# Patient Record
Sex: Male | Born: 1955 | Race: White | Hispanic: No | Marital: Married | Smoking: Never smoker
Health system: Southern US, Community
[De-identification: ages and names within clinical notes are randomized; demographics above are authoritative.]

## PROBLEM LIST (undated history)

## (undated) DIAGNOSIS — M109 Gout, unspecified: Secondary | ICD-10-CM

## (undated) DIAGNOSIS — N189 Chronic kidney disease, unspecified: Secondary | ICD-10-CM

## (undated) DIAGNOSIS — Z87442 Personal history of urinary calculi: Secondary | ICD-10-CM

## (undated) DIAGNOSIS — L570 Actinic keratosis: Secondary | ICD-10-CM

## (undated) DIAGNOSIS — I10 Essential (primary) hypertension: Secondary | ICD-10-CM

## (undated) DIAGNOSIS — K635 Polyp of colon: Secondary | ICD-10-CM

## (undated) DIAGNOSIS — E785 Hyperlipidemia, unspecified: Secondary | ICD-10-CM

## (undated) DIAGNOSIS — M1990 Unspecified osteoarthritis, unspecified site: Secondary | ICD-10-CM

## (undated) DIAGNOSIS — K429 Umbilical hernia without obstruction or gangrene: Secondary | ICD-10-CM

## (undated) DIAGNOSIS — S2239XA Fracture of one rib, unspecified side, initial encounter for closed fracture: Secondary | ICD-10-CM

## (undated) HISTORY — DX: Actinic keratosis: L57.0

## (undated) HISTORY — PX: ROTATOR CUFF REPAIR: SHX139

## (undated) HISTORY — DX: Polyp of colon: K63.5

## (undated) HISTORY — DX: Fracture of one rib, unspecified side, initial encounter for closed fracture: S22.39XA

## (undated) HISTORY — PX: JOINT REPLACEMENT: SHX530

## (undated) HISTORY — PX: HERNIA REPAIR: SHX51

## (undated) HISTORY — DX: Essential (primary) hypertension: I10

## (undated) HISTORY — DX: Hyperlipidemia, unspecified: E78.5

---
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
## 2007-07-08 ENCOUNTER — Emergency Department: Payer: Self-pay | Admitting: Emergency Medicine

## 2007-07-08 ENCOUNTER — Other Ambulatory Visit: Payer: Self-pay

## 2008-07-06 ENCOUNTER — Emergency Department: Payer: Self-pay | Admitting: Emergency Medicine

## 2010-07-27 ENCOUNTER — Emergency Department: Payer: Self-pay | Admitting: Emergency Medicine

## 2015-07-17 DIAGNOSIS — K635 Polyp of colon: Secondary | ICD-10-CM

## 2015-07-17 HISTORY — DX: Polyp of colon: K63.5

## 2015-07-17 HISTORY — PX: COLONOSCOPY: SHX174

## 2015-09-07 ENCOUNTER — Encounter: Payer: Self-pay | Admitting: *Deleted

## 2015-09-15 ENCOUNTER — Ambulatory Visit (INDEPENDENT_AMBULATORY_CARE_PROVIDER_SITE_OTHER): Payer: BLUE CROSS/BLUE SHIELD | Admitting: General Surgery

## 2015-09-15 ENCOUNTER — Encounter: Payer: Self-pay | Admitting: General Surgery

## 2015-09-15 VITALS — BP 166/88 | HR 80 | Resp 14 | Ht 72.0 in | Wt 211.0 lb

## 2015-09-15 DIAGNOSIS — K429 Umbilical hernia without obstruction or gangrene: Secondary | ICD-10-CM

## 2015-09-15 NOTE — Patient Instructions (Addendum)
The patient is aware to call back for any questions or concerns.  Hernia, Adult A hernia is the bulging of an organ or tissue through a weak spot in the muscles of the abdomen (abdominal wall). Hernias develop most often near the navel or groin. There are many kinds of hernias. Common kinds include:  Femoral hernia. This kind of hernia develops under the groin in the upper thigh area.  Inguinal hernia. This kind of hernia develops in the groin or scrotum.  Umbilical hernia. This kind of hernia develops near the navel.  Hiatal hernia. This kind of hernia causes part of the stomach to be pushed up into the chest.  Incisional hernia. This kind of hernia bulges through a scar from an abdominal surgery. CAUSES This condition may be caused by:  Heavy lifting.  Coughing over a long period of time.  Straining to have a bowel movement.  An incision made during an abdominal surgery.  A birth defect (congenital defect).  Excess weight or obesity.  Smoking.  Poor nutrition.  Cystic fibrosis.  Excess fluid in the abdomen.  Undescended testicles. SYMPTOMS Symptoms of a hernia include:  A lump on the abdomen. This is the first sign of a hernia. The lump may become more obvious with standing, straining, or coughing. It may get bigger over time if it is not treated or if the condition causing it is not treated.  Pain. A hernia is usually painless, but it may become painful over time if treatment is delayed. The pain is usually dull and may get worse with standing or lifting heavy objects. Sometimes a hernia gets tightly squeezed in the weak spot (strangulated) or stuck there (incarcerated) and causes additional symptoms. These symptoms may include:  Vomiting.  Nausea.  Constipation.  Irritability. DIAGNOSIS A hernia may be diagnosed with:  A physical exam. During the exam your health care provider may ask you to cough or to make a specific movement, because a hernia is usually  more visible when you move.  Imaging tests. These can include:  X-rays.  Ultrasound.  CT scan. TREATMENT A hernia that is small and painless may not need to be treated. A hernia that is large or painful may be treated with surgery. Inguinal hernias may be treated with surgery to prevent incarceration or strangulation. Strangulated hernias are always treated with surgery, because lack of blood to the trapped organ or tissue can cause it to die. Surgery to treat a hernia involves pushing the bulge back into place and repairing the weak part of the abdomen. HOME CARE INSTRUCTIONS  Avoid straining.  Do not lift anything heavier than 10 lb (4.5 kg).  Lift with your leg muscles, not your back muscles. This helps avoid strain.  When coughing, try to cough gently.  Prevent constipation. Constipation leads to straining with bowel movements, which can make a hernia worse or cause a hernia repair to break down. You can prevent constipation by:  Eating a high-fiber diet that includes plenty of fruits and vegetables.  Drinking enough fluids to keep your urine clear or pale yellow. Aim to drink 6-8 glasses of water per day.  Using a stool softener as directed by your health care provider.  Lose weight, if you are overweight.  Do not use any tobacco products, including cigarettes, chewing tobacco, or electronic cigarettes. If you need help quitting, ask your health care provider.  Keep all follow-up visits as directed by your health care provider. This is important. Your health care provider  may need to monitor your condition. SEEK MEDICAL CARE IF:  You have swelling, redness, and pain in the affected area.  Your bowel habits change. SEEK IMMEDIATE MEDICAL CARE IF:  You have a fever.  You have abdominal pain that is getting worse.  You feel nauseous or you vomit.  You cannot push the hernia back in place by gently pressing on it while you are lying down.  The hernia:  Changes in  shape or size.  Is stuck outside the abdomen.  Becomes discolored.  Feels hard or tender.   This information is not intended to replace advice given to you by your health care provider. Make sure you discuss any questions you have with your health care provider.   Document Released: 07/02/2005 Document Revised: 07/23/2014 Document Reviewed: 05/12/2014 Elsevier Interactive Patient Education 2016 [employer].  Patient's surgery has been scheduled for 10-13-15 at [facility].

## 2015-09-15 NOTE — Progress Notes (Signed)
Patient ID: Erik Moore, male   DOB: Jan 13, 1956, 60 y.o.   MRN: [id number]  Chief Complaint  Patient presents with  . Hernia    HPI Erik Moore is a 60 y.o. male.  Here today for evaluation of a possible umbilical hernia. 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 He is able to press it back in with no pain. Bowels move daily and are regular. He does not think it has changed dramatically in size.  He originally had surgery set up at a [facility] affiliated hospital, but it was canceled any never rescheduled.  I personally reviewed the patient's history.   HPI  Past Medical History  Diagnosis Date  . Hypertension   . Hyperlipidemia   . Colon polyp 2017  . Rib fracture     Past Surgical History  Procedure Laterality Date  . Tonsillectomy  1965  . Colonoscopy  2017    Dr Clayborne Dana    No family history on file.  Social History Social History  Substance Use Topics  . Smoking status: Never Smoker   . Smokeless tobacco: Never Used  . Alcohol Use: 0.0 oz/week    0 Standard drinks or equivalent per week     Comment: 3-4/day    No Known Allergies  Current Outpatient Prescriptions  Medication Sig Dispense Refill  . aspirin 81 MG tablet Take 81 mg by mouth every other day.    . hydrochlorothiazide (HYDRODIURIL) 12.5 MG tablet Take 12.5 mg by mouth daily.   0  . losartan (COZAAR) 50 MG tablet Take 50 mg by mouth daily.  0   No current facility-administered medications for this visit.    Review of Systems Review of Systems  Constitutional: Negative.   Respiratory: Negative.   Cardiovascular: Negative.   Gastrointestinal: Negative for nausea, abdominal pain, diarrhea and constipation.    Blood pressure 166/88, pulse 80, resp. rate 14, height 6' (1.829 m), weight 211 lb (95.709 kg).  Physical Exam Physical Exam  Constitutional: He is oriented to person, place,  and time. He appears well-developed and well-nourished.  HENT:  Mouth/Throat: Oropharynx is clear and moist.  Eyes: Conjunctivae are normal. No scleral icterus.  Neck: Neck supple.  Cardiovascular: Normal rate, regular rhythm and normal heart sounds.   Pulmonary/Chest: Effort normal and breath sounds normal.  Abdominal: Soft. Normal appearance. There is no tenderness.    Diastasis recti present. 1 cm umbilical facial defect.  Lymphadenopathy:    He has no cervical adenopathy.  Neurological: He is alert and oriented to person, place, and time.  Skin: Skin is warm and dry.  Psychiatric: His behavior is normal.    Data Reviewed Laboratory studies from his PCP office dated 06/22/2015, nonfasting, showed a scant elevation of the blood sugar 103, normal potassium at 4.6, creatinine of 0.9 with an estimated GFR of 92, normal liver function studies. Modestly elevated serum triglycerides at 156, cholesterol 185. Globin A1c 5.0     Assessment    Umbilical hernia, reducible.    Plan    Pros and cons of elective hernia repair reviewed.    Hernia precautions and incarceration were discussed with the patient. If they develop symptoms of an incarcerated hernia, they were encouraged to seek prompt medical attention.  I have recommended repair of the hernia on an outpatient basis in the near future. I think it is unlikely the patient will  require mesh repair if the defect is less than 3 cm in diameter. The risk of infection was reviewed. The role of prosthetic mesh to minimize the risk of recurrence was reviewed and larger hernias was reviewed.  Patient's surgery has been scheduled for 10-13-15 at [facility]. It is okay for patient to remain on 81 mg aspirin pre-op.  Ref: Honor Loh FNA/ Dr. Benita Gutter at [place]  This information has been scribed by Karie Fetch [place].    Robert Bellow 09/16/2015, 5:09 PM

## 2015-09-16 DIAGNOSIS — K429 Umbilical hernia without obstruction or gangrene: Secondary | ICD-10-CM | POA: Insufficient documentation

## 2015-09-16 NOTE — H&P (Signed)
HPI  Erik Moore is a 60 y.o. male. Here today for evaluation of a possible umbilical hernia. 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 He is able to press it back in with no pain. Bowels move daily and are regular. He does not think it has changed dramatically in size.  He originally had surgery set up at a [facility] affiliated hospital, but it was canceled any never rescheduled.  I personally reviewed the patient's history.  HPI  Past Medical History   Diagnosis  Date   .  Hypertension    .  Hyperlipidemia    .  Colon polyp  2017   .  Rib fracture     Past Surgical History   Procedure  Laterality  Date   .  Tonsillectomy   1965   .  Colonoscopy   2017     Dr Clayborne Dana    No family history on file.  Social History  Social History   Substance Use Topics   .  Smoking status:  Never Smoker   .  Smokeless tobacco:  Never Used   .  Alcohol Use:  0.0 oz/week     0 Standard drinks or equivalent per week      Comment: 3-4/day    No Known Allergies  Current Outpatient Prescriptions   Medication  Sig  Dispense  Refill   .  aspirin 81 MG tablet  Take 81 mg by mouth every other day.     .  hydrochlorothiazide (HYDRODIURIL) 12.5 MG tablet  Take 12.5 mg by mouth daily.   0   .  losartan (COZAAR) 50 MG tablet  Take 50 mg by mouth daily.   0    No current facility-administered medications for this visit.    Review of Systems  Review of Systems  Constitutional: Negative.  Respiratory: Negative.  Cardiovascular: Negative.  Gastrointestinal: Negative for nausea, abdominal pain, diarrhea and constipation.   Blood pressure 166/88, pulse 80, resp. rate 14, height 6' (1.829 m), weight 211 lb (95.709 kg).  Physical Exam  Physical Exam  Constitutional: He is oriented to person, place, and time. He appears well-developed and well-nourished.  HENT:  Mouth/Throat: Oropharynx is clear and  moist.  Eyes: Conjunctivae are normal. No scleral icterus.  Neck: Neck supple.  Cardiovascular: Normal rate, regular rhythm and normal heart sounds.  Pulmonary/Chest: Effort normal and breath sounds normal.  Abdominal: Soft. Normal appearance. There is no tenderness.    Diastasis recti present. 1 cm umbilical facial defect.  Lymphadenopathy:  He has no cervical adenopathy.  Neurological: He is alert and oriented to person, place, and time.  Skin: Skin is warm and dry.  Psychiatric: His behavior is normal.   Data Reviewed  Laboratory studies from his PCP office dated 06/22/2015, nonfasting, showed a scant elevation of the blood sugar 103, normal potassium at 4.6, creatinine of 0.9 with an estimated GFR of 92, normal liver function studies. Modestly elevated serum triglycerides at 156, cholesterol 185. Globin A1c 5.0  Assessment   Umbilical hernia, reducible.   Plan   Pros and cons of elective hernia repair reviewed.   Hernia precautions and incarceration were discussed with the patient. If they develop symptoms of an incarcerated hernia, they were encouraged to seek prompt medical attention.  I have recommended repair of the hernia on an outpatient basis in the near future. I  think it is unlikely the patient will require mesh repair if the defect is less than 3 cm in diameter. The risk of infection was reviewed. The role of prosthetic mesh to minimize the risk of recurrence was reviewed and larger hernias was reviewed.  Patient's surgery has been scheduled for 10-13-15 at [facility]. It is okay for patient to remain on 81 mg aspirin pre-op.  Ref: Honor Loh FNA/ Dr. Benita Gutter at [place]  This information has been scribed by Karie Fetch [place].  Robert Bellow  09/16/2015, 5:09 PM

## 2015-10-03 ENCOUNTER — Other Ambulatory Visit: Payer: Self-pay

## 2015-10-03 ENCOUNTER — Encounter: Payer: Self-pay | Admitting: *Deleted

## 2015-10-03 NOTE — Patient Instructions (Signed)
  Your procedure is scheduled on: 10-13-15 (THURSDAY) Report to [facility] To find out your arrival time please call [phone] between 1PM - 3PM on 10-12-15 [facility])  Remember: Instructions that are not followed completely may result in serious medical risk, up to and including death, or upon the discretion of your surgeon and anesthesiologist your surgery may need to be rescheduled.    _X___ 1. Do not eat food or drink liquids after midnight. No gum chewing or hard candies.     _X___ 2. No Alcohol for 24 hours before or after surgery.   ____ 3. Bring all medications with you on the day of surgery if instructed.    _X___ 4. Notify your doctor if there is any change in your medical condition     (cold, fever, infections).     Do not wear jewelry, make-up, hairpins, clips or nail polish.  Do not wear lotions, powders, or perfumes. You may wear deodorant.  Do not shave 48 hours prior to surgery. Men may shave face and neck.  Do not bring valuables to the hospital.    [facility] is not responsible for any belongings or valuables.               Contacts, dentures or bridgework may not be worn into surgery.  Leave your suitcase in the car. After surgery it may be brought to your room.  For patients admitted to the hospital, discharge time is determined by your treatment team.   Patients discharged the day of surgery will not be allowed to drive home.   Please read over the following fact sheets that you were given:     _X___ Take these medicines the morning of surgery with A SIP OF WATER:    1. LOSARTAN (COZAAR)  2.   3.   4.  5.  6.  ____ Fleet Enema (as directed)   _X___ Use CHG Soap as directed  ____ Use inhalers on the day of surgery  ____ Stop metformin 2 days prior to surgery    ____ Take 1/2 of usual insulin dose the night before surgery and none on the morning of surgery.   ____ Stop Coumadin/Plavix/aspirin-OK TO CONTINUE 81 MG  ASPIRIN PER DR BYRNETT  ____ Stop Anti-inflammatories   ____ Stop supplements until after surgery.    ____ Bring C-Pap to the hospital.

## 2015-10-10 ENCOUNTER — Encounter
Admission: RE | Admit: 2015-10-10 | Discharge: 2015-10-10 | Disposition: A | Discharge status: Home / Self Care | Payer: BLUE CROSS/BLUE SHIELD | Source: Ambulatory Visit | Attending: General Surgery | Admitting: General Surgery

## 2015-10-10 DIAGNOSIS — Z86010 Personal history of colonic polyps: Secondary | ICD-10-CM | POA: Diagnosis not present

## 2015-10-10 DIAGNOSIS — E785 Hyperlipidemia, unspecified: Secondary | ICD-10-CM | POA: Diagnosis not present

## 2015-10-10 DIAGNOSIS — K429 Umbilical hernia without obstruction or gangrene: Secondary | ICD-10-CM | POA: Diagnosis present

## 2015-10-10 DIAGNOSIS — Z79899 Other long term (current) drug therapy: Secondary | ICD-10-CM | POA: Diagnosis not present

## 2015-10-10 DIAGNOSIS — I10 Essential (primary) hypertension: Secondary | ICD-10-CM | POA: Diagnosis not present

## 2015-10-10 LAB — POTASSIUM: POTASSIUM: 3.8 mmol/L (ref 3.5–5.1)

## 2015-10-13 ENCOUNTER — Ambulatory Visit: Payer: BLUE CROSS/BLUE SHIELD | Admitting: Anesthesiology

## 2015-10-13 ENCOUNTER — Encounter: Payer: Self-pay | Admitting: Anesthesiology

## 2015-10-13 ENCOUNTER — Encounter
Admission: RE | Disposition: A | Discharge status: Home / Self Care | Payer: Self-pay | Source: Ambulatory Visit | Attending: General Surgery

## 2015-10-13 ENCOUNTER — Telehealth: Payer: Self-pay | Admitting: *Deleted

## 2015-10-13 ENCOUNTER — Ambulatory Visit
Admission: RE | Admit: 2015-10-13 | Discharge: 2015-10-13 | Disposition: A | Discharge status: Home / Self Care | Payer: BLUE CROSS/BLUE SHIELD | Source: Ambulatory Visit | Attending: General Surgery | Admitting: General Surgery

## 2015-10-13 DIAGNOSIS — I10 Essential (primary) hypertension: Secondary | ICD-10-CM | POA: Insufficient documentation

## 2015-10-13 DIAGNOSIS — K429 Umbilical hernia without obstruction or gangrene: Secondary | ICD-10-CM | POA: Insufficient documentation

## 2015-10-13 DIAGNOSIS — Z79899 Other long term (current) drug therapy: Secondary | ICD-10-CM | POA: Insufficient documentation

## 2015-10-13 DIAGNOSIS — Z86010 Personal history of colonic polyps: Secondary | ICD-10-CM | POA: Insufficient documentation

## 2015-10-13 DIAGNOSIS — E785 Hyperlipidemia, unspecified: Secondary | ICD-10-CM | POA: Insufficient documentation

## 2015-10-13 HISTORY — PX: UMBILICAL HERNIA REPAIR: SHX196

## 2015-10-13 HISTORY — DX: Chronic kidney disease, unspecified: N18.9

## 2015-10-13 SURGERY — REPAIR, HERNIA, UMBILICAL, ADULT
Anesthesia: General

## 2015-10-13 MED ORDER — LACTATED RINGERS IV SOLN
INTRAVENOUS | Status: DC
  Administered 2015-10-13 (×2): via INTRAVENOUS

## 2015-10-13 MED ORDER — PHENYLEPHRINE HCL 10 MG/ML IJ SOLN
INTRAMUSCULAR | Status: DC | PRN
  Administered 2015-10-13 (×2): 100 ug via INTRAVENOUS

## 2015-10-13 MED ORDER — PROPOFOL 10 MG/ML IV BOLUS
INTRAVENOUS | Status: DC | PRN
  Administered 2015-10-13: 160 mg via INTRAVENOUS

## 2015-10-13 MED ORDER — BUPIVACAINE HCL 0.5 % IJ SOLN
INTRAMUSCULAR | Status: DC | PRN
  Administered 2015-10-13: 30 mL

## 2015-10-13 MED ORDER — BUPIVACAINE HCL (PF) 0.5 % IJ SOLN
INTRAMUSCULAR | Status: AC
  Filled 2015-10-13: qty 30

## 2015-10-13 MED ORDER — HYDROCODONE-ACETAMINOPHEN 5-325 MG PO TABS: 1.0000 | ORAL_TABLET | ORAL | Status: DC | PRN

## 2015-10-13 MED ORDER — FENTANYL CITRATE (PF) 100 MCG/2ML IJ SOLN
INTRAMUSCULAR | Status: DC | PRN
  Administered 2015-10-13: 100 ug via INTRAVENOUS

## 2015-10-13 MED ORDER — DEXTROSE 5 % IV SOLN
2.0000 g | INTRAVENOUS | Status: DC
  Filled 2015-10-13: qty 20

## 2015-10-13 MED ORDER — DEXAMETHASONE SODIUM PHOSPHATE 10 MG/ML IJ SOLN
INTRAMUSCULAR | Status: DC | PRN
  Administered 2015-10-13: 10 mg via INTRAVENOUS

## 2015-10-13 MED ORDER — LIDOCAINE HCL (PF) 1 % IJ SOLN
INTRAMUSCULAR | Status: AC
  Filled 2015-10-13: qty 2

## 2015-10-13 MED ORDER — FAMOTIDINE 20 MG PO TABS
ORAL_TABLET | ORAL | Status: AC
  Administered 2015-10-13: 20 mg via ORAL
  Filled 2015-10-13: qty 1

## 2015-10-13 MED ORDER — ACETAMINOPHEN 10 MG/ML IV SOLN
INTRAVENOUS | Status: DC | PRN
  Administered 2015-10-13: 1000 mg via INTRAVENOUS

## 2015-10-13 MED ORDER — CEFAZOLIN SODIUM-DEXTROSE 2-4 GM/100ML-% IV SOLN
INTRAVENOUS | Status: AC
  Filled 2015-10-13: qty 100

## 2015-10-13 MED ORDER — ONDANSETRON HCL 4 MG/2ML IJ SOLN: 4.0000 mg | Freq: Once | INTRAMUSCULAR | Status: DC | PRN

## 2015-10-13 MED ORDER — MIDAZOLAM HCL 2 MG/2ML IJ SOLN
INTRAMUSCULAR | Status: DC | PRN
  Administered 2015-10-13: 2 mg via INTRAVENOUS

## 2015-10-13 MED ORDER — FENTANYL CITRATE (PF) 100 MCG/2ML IJ SOLN: 25.0000 ug | INTRAMUSCULAR | Status: DC | PRN

## 2015-10-13 MED ORDER — LIDOCAINE HCL (CARDIAC) 20 MG/ML IV SOLN
INTRAVENOUS | Status: DC | PRN
  Administered 2015-10-13: 100 mg via INTRAVENOUS

## 2015-10-13 MED ORDER — KETOROLAC TROMETHAMINE 30 MG/ML IJ SOLN
INTRAMUSCULAR | Status: DC | PRN
  Administered 2015-10-13: 30 mg via INTRAVENOUS

## 2015-10-13 MED ORDER — ACETAMINOPHEN 10 MG/ML IV SOLN
INTRAVENOUS | Status: AC
  Filled 2015-10-13: qty 100

## 2015-10-13 MED ORDER — ONDANSETRON HCL 4 MG/2ML IJ SOLN
INTRAMUSCULAR | Status: DC | PRN
  Administered 2015-10-13: 4 mg via INTRAVENOUS

## 2015-10-13 MED ORDER — CEFAZOLIN SODIUM-DEXTROSE 2-4 GM/100ML-% IV SOLN: 2.0000 g | INTRAVENOUS | Status: DC

## 2015-10-13 MED ORDER — FAMOTIDINE 20 MG PO TABS
20.0000 mg | ORAL_TABLET | Freq: Once | ORAL | Status: AC
  Administered 2015-10-13: 20 mg via ORAL

## 2015-10-13 SURGICAL SUPPLY — 32 items
BLADE SURG 15 STRL SS SAFETY (BLADE) ×3 IMPLANT
CANISTER SUCT 1200ML W/VALVE (MISCELLANEOUS) ×3 IMPLANT
CHLORAPREP W/TINT 26ML (MISCELLANEOUS) ×3 IMPLANT
CLOSURE WOUND 1/2 X4 (GAUZE/BANDAGES/DRESSINGS) ×1
DRAPE LAPAROTOMY 100X77 ABD (DRAPES) ×3 IMPLANT
DRESSING TELFA 4X3 1S ST N-ADH (GAUZE/BANDAGES/DRESSINGS) ×3 IMPLANT
DRSG TEGADERM 4X4.75 (GAUZE/BANDAGES/DRESSINGS) ×3 IMPLANT
ELECT REM PT RETURN 9FT ADLT (ELECTROSURGICAL) ×3
ELECTRODE REM PT RTRN 9FT ADLT (ELECTROSURGICAL) ×1 IMPLANT
GAUZE SPONGE 4X4 12PLY STRL (GAUZE/BANDAGES/DRESSINGS) IMPLANT
GLOVE BIO SURGEON STRL SZ7.5 (GLOVE) ×9 IMPLANT
GLOVE INDICATOR 8.0 STRL GRN (GLOVE) ×9 IMPLANT
GOWN STRL REUS W/ TWL LRG LVL3 (GOWN DISPOSABLE) ×3 IMPLANT
GOWN STRL REUS W/TWL LRG LVL3 (GOWN DISPOSABLE) ×6
KIT RM TURNOVER STRD PROC AR (KITS) ×3 IMPLANT
LABEL OR SOLS (LABEL) ×3 IMPLANT
NDL SAFETY 22GX1.5 (NEEDLE) ×3 IMPLANT
NEEDLE HYPO 25X1 1.5 SAFETY (NEEDLE) ×3 IMPLANT
NS IRRIG 500ML POUR BTL (IV SOLUTION) ×3 IMPLANT
PACK BASIN MINOR ARMC (MISCELLANEOUS) ×3 IMPLANT
PAD TELFA 2X3 NADH STRL (GAUZE/BANDAGES/DRESSINGS) ×3 IMPLANT
STRIP CLOSURE SKIN 1/2X4 (GAUZE/BANDAGES/DRESSINGS) ×2 IMPLANT
SUT PROLENE 0 CT 1 30 (SUTURE) IMPLANT
SUT SURGILON 0 BLK (SUTURE) ×3 IMPLANT
SUT VIC AB 3-0 54X BRD REEL (SUTURE) ×1 IMPLANT
SUT VIC AB 3-0 BRD 54 (SUTURE) ×2
SUT VIC AB 3-0 SH 27 (SUTURE) ×2
SUT VIC AB 3-0 SH 27X BRD (SUTURE) ×1 IMPLANT
SUT VIC AB 4-0 FS2 27 (SUTURE) ×3 IMPLANT
SWABSTK COMLB BENZOIN TINCTURE (MISCELLANEOUS) ×3 IMPLANT
SYR 3ML LL SCALE MARK (SYRINGE) ×3 IMPLANT
SYR CONTROL 10ML (SYRINGE) ×3 IMPLANT

## 2015-10-13 NOTE — Transfer of Care (Signed)
Immediate Anesthesia Transfer of Care Note  Patient: Erik Moore  Procedure(s) Performed: Procedure(s): HERNIA REPAIR UMBILICAL ADULT (N/A)  Patient Location: PACU  Anesthesia Type:General  Level of Consciousness: sedated  Airway & Oxygen Therapy: Patient Spontanous Breathing and Patient connected to face mask oxygen  Post-op Assessment: Report given to RN and Post -op Vital signs reviewed and stable  Post vital signs: Reviewed and stable  Last Vitals:  Filed Vitals:   10/13/15 0813  BP: 144/86  Pulse: 70  Temp: 36.7 C  Resp: 16    Complications: No apparent anesthesia complications

## 2015-10-13 NOTE — Op Note (Signed)
Preoperative diagnosis: Umbilical hernia.  Postoperative diagnosis: Same.  Operative procedure: Umbilical hernia repair.  Operating surgeon: Ollen Bowl, M.D.  Anesthesia: Gen. by LMA, Marcaine 0.5%, plain, 30 mL local infiltration.  Estimated blood loss: Less than 5 mL.  Clinical note: This 60 year old male has developed an umbilical hernia and a similar for elective repair. Hair was removed from the surgical site with clippers prior to presentation to the operating theater. He received Kefzol intravenously in the event prosthetic mesh wasn't required.  Operative note: With the patient under adequate general anesthesia the abdomen was prepped with ChloraPrep and draped. Marcaine was infiltrated for postoperative analgesia. An infraumbilical incision was made and carried down through skin and subcutaneous tissue with hemostasis achieved by electrocautery. The umbilical skin was elevated off the underlying sac with cautery. The sac was excised at the fascial layer and discarded. The undersurface of the fascia was cleared. Interrupted 0 Surgilon sutures were used to close the 1.2 cm fascial defect. The umbilical skin was tacked to the fascia with a 3-0 Vicryl figure-of-eight suture. The adipose layer was closed with a running 3-0 Vicryl suture. Skin was approximated with a running 4-0 Vicryl septic suture. Benzoin, Steri-Strips, Telfa and Tegaderm dressings were applied.  The patient tolerated the procedure well and was taken to the recovery room in stable condition.

## 2015-10-13 NOTE — Anesthesia Procedure Notes (Signed)
Procedure Name: LMA Insertion Date/Time: 10/13/2015 8:58 AM Performed by: Nelda Marseille Pre-anesthesia Checklist: Patient identified, Patient being monitored, Timeout performed, Emergency Drugs available and Suction available Patient Re-evaluated:Patient Re-evaluated prior to inductionOxygen Delivery Method: Circle system utilized Preoxygenation: Pre-oxygenation with 100% oxygen Intubation Type: IV induction Ventilation: Mask ventilation without difficulty LMA: LMA inserted LMA Size: 4.5 Tube type: Oral Number of attempts: 1 Placement Confirmation: positive ETCO2 and breath sounds checked- equal and bilateral Tube secured with: Tape Dental Injury: Teeth and Oropharynx as per pre-operative assessment

## 2015-10-13 NOTE — Telephone Encounter (Signed)
Patient called in at 3:15 pm states he saw small amount blood on the bandage and wanted to know if that was ok. Denies swelling.  Discussed with Rosann Auerbach, monitor the area for changes in symptoms, advised to use ice pack off and on today and call in the morning with a status update.

## 2015-10-13 NOTE — Discharge Instructions (Signed)

## 2015-10-13 NOTE — OR Nursing (Signed)
Dr. Bary Castilla in to visit patient prior to discharge.  Patient doing well with minimal discomforts and understands the limitations from surgery

## 2015-10-13 NOTE — H&P (Signed)
No change in clinical history or exam. For umbilical hernia repair.  

## 2015-10-13 NOTE — Anesthesia Preprocedure Evaluation (Signed)
Anesthesia Evaluation  Patient identified by MRN, date of birth, ID band Patient awake    Reviewed: Allergy & Precautions, NPO status , Patient's Chart, lab work & pertinent test results  Airway Mallampati: III  TM Distance: <3 FB Neck ROM: Full    Dental  (+) Chipped   Pulmonary    Pulmonary exam normal breath sounds clear to auscultation       Cardiovascular hypertension, Pt. on medications Normal cardiovascular exam     Neuro/Psych    GI/Hepatic negative GI ROS, Neg liver ROS,   Endo/Other  negative endocrine ROS  Renal/GU Renal InsufficiencyRenal disease     Musculoskeletal negative musculoskeletal ROS (+)   Abdominal Normal abdominal exam  (+)   Peds  Hematology negative hematology ROS (+)   Anesthesia Other Findings   Reproductive/Obstetrics                             Anesthesia Physical Anesthesia Plan  ASA: II  Anesthesia Plan: General   Post-op Pain Management:    Induction: Intravenous  Airway Management Planned: LMA  Additional Equipment:   Intra-op Plan:   Post-operative Plan: Extubation in OR  Informed Consent: I have reviewed the patients History and Physical, chart, labs and discussed the procedure including the risks, benefits and alternatives for the proposed anesthesia with the patient or authorized representative who has indicated his/her understanding and acceptance.   Dental advisory given  Plan Discussed with: CRNA and Surgeon  Anesthesia Plan Comments:         Anesthesia Quick Evaluation

## 2015-10-18 NOTE — Anesthesia Postprocedure Evaluation (Signed)
Anesthesia Post Note  Patient: Erik Moore  Procedure(s) Performed: Procedure(s) (LRB): HERNIA REPAIR UMBILICAL ADULT (N/A)  Patient location during evaluation: PACU Anesthesia Type: General Level of consciousness: awake and alert and oriented Pain management: pain level controlled Vital Signs Assessment: post-procedure vital signs reviewed and stable Respiratory status: spontaneous breathing Cardiovascular status: blood pressure returned to baseline Anesthetic complications: no    Last Vitals:  Filed Vitals:   10/13/15 1015 10/13/15 1032  BP:  135/80  Pulse:  60  Temp: 36.6 C 36.4 C  Resp:  16    Last Pain:  Filed Vitals:   10/14/15 0832  PainSc: 0-No pain                 [name]

## 2015-10-20 ENCOUNTER — Ambulatory Visit (INDEPENDENT_AMBULATORY_CARE_PROVIDER_SITE_OTHER): Payer: BLUE CROSS/BLUE SHIELD | Admitting: General Surgery

## 2015-10-20 ENCOUNTER — Encounter: Payer: Self-pay | Admitting: General Surgery

## 2015-10-20 VITALS — BP 152/88 | HR 76 | Resp 12 | Ht 72.0 in | Wt 209.0 lb

## 2015-10-20 DIAGNOSIS — K429 Umbilical hernia without obstruction or gangrene: Secondary | ICD-10-CM

## 2015-10-20 NOTE — Patient Instructions (Addendum)
The patient is aware to call back for any questions or concerns. Patient to return as needed. 

## 2015-10-20 NOTE — Progress Notes (Signed)
Patient ID: Erik Moore, male   DOB: 04-12-1956, 60 y.o.   MRN: [id number]  Chief Complaint  Patient presents with  . Routine Post Op    HPI Erik Moore is a 60 y.o. male.  Here today for postoperative visit, umbilical hernia on [id number]. He states he is doing well. Minimal to no pain.  HPI  Past Medical History  Diagnosis Date  . Hypertension   . Hyperlipidemia   . Colon polyp 2017  . Rib fracture   . Chronic kidney disease     H/O KIDNEY STONES    Past Surgical History  Procedure Laterality Date  . Tonsillectomy  1965  . Colonoscopy  2017    Dr Clayborne Dana  . Umbilical hernia repair N/A 10/13/2015    Procedure: HERNIA REPAIR UMBILICAL ADULT;  Surgeon: Robert Bellow, MD;  Location: [facility] ORS;  Service: General;  Laterality: N/A;    No family history on file.  Social History Social History  Substance Use Topics  . Smoking status: Never Smoker   . Smokeless tobacco: Never Used  . Alcohol Use: 0.0 oz/week    0 Standard drinks or equivalent per week     Comment: 3-4/day    No Known Allergies  Current Outpatient Prescriptions  Medication Sig Dispense Refill  . aspirin 81 MG tablet Take 81 mg by mouth every other day.    . hydrochlorothiazide (HYDRODIURIL) 12.5 MG tablet Take 12.5 mg by mouth daily.   0  . losartan (COZAAR) 50 MG tablet Take 50 mg by mouth every morning.   0   No current facility-administered medications for this visit.    Review of Systems Review of Systems  Constitutional: Negative.   Respiratory: Negative.   Cardiovascular: Negative.     Blood pressure 152/88, pulse 76, resp. rate 12, height 6' (1.829 m), weight 209 lb (94.802 kg).  Physical Exam Physical Exam  Constitutional: He is oriented to person, place, and time. He appears well-developed and well-nourished.  HENT:  Mouth/Throat: Oropharynx is clear and moist.  Eyes: Conjunctivae are normal. No scleral icterus.  Neck: Neck supple.  Abdominal: Soft. There is no tenderness.     Umbilical hernia is clean and intact.  Lymphadenopathy:    He has no cervical adenopathy.  Neurological: He is alert and oriented to person, place, and time.  Skin: Skin is warm and dry.  Psychiatric: His behavior is normal.       Assessment    Doing well status post primary repair umbilical hernia.    Plan    The patient will increase his activity as tolerated. Proper lifting technique reviewed.    Patient to return as needed.  Ref: Honor Loh FNA/ Dr. Benita Gutter at [place]    This information has been scribed by Karie Fetch RN, BSN,BC.   Robert Bellow 10/20/2015, 2:17 PM

## 2019-07-27 ENCOUNTER — Other Ambulatory Visit: Payer: Self-pay | Admitting: Physical Medicine and Rehabilitation

## 2019-07-27 DIAGNOSIS — M25511 Pain in right shoulder: Secondary | ICD-10-CM

## 2019-07-27 DIAGNOSIS — M5416 Radiculopathy, lumbar region: Secondary | ICD-10-CM

## 2019-08-05 ENCOUNTER — Other Ambulatory Visit: Payer: Self-pay

## 2019-08-05 ENCOUNTER — Ambulatory Visit
Admission: RE | Admit: 2019-08-05 | Discharge: 2019-08-05 | Disposition: A | Discharge status: Home / Self Care | Payer: Medicare Other | Source: Ambulatory Visit | Attending: Physical Medicine and Rehabilitation | Admitting: Physical Medicine and Rehabilitation

## 2019-08-05 DIAGNOSIS — M25511 Pain in right shoulder: Secondary | ICD-10-CM | POA: Diagnosis present

## 2019-08-05 DIAGNOSIS — M5416 Radiculopathy, lumbar region: Secondary | ICD-10-CM

## 2020-06-24 ENCOUNTER — Other Ambulatory Visit: Payer: Self-pay | Admitting: Orthopedic Surgery

## 2020-07-04 ENCOUNTER — Other Ambulatory Visit: Payer: Self-pay

## 2020-07-04 ENCOUNTER — Encounter: Payer: Self-pay | Admitting: Orthopedic Surgery

## 2020-07-11 ENCOUNTER — Other Ambulatory Visit
Admission: RE | Admit: 2020-07-11 | Discharge: 2020-07-11 | Disposition: A | Discharge status: Home / Self Care | Payer: Medicare Other | Source: Ambulatory Visit | Attending: Orthopedic Surgery | Admitting: Orthopedic Surgery

## 2020-07-11 ENCOUNTER — Other Ambulatory Visit: Payer: Self-pay

## 2020-07-11 DIAGNOSIS — Z01812 Encounter for preprocedural laboratory examination: Secondary | ICD-10-CM | POA: Insufficient documentation

## 2020-07-11 DIAGNOSIS — Z20822 Contact with and (suspected) exposure to covid-19: Secondary | ICD-10-CM | POA: Insufficient documentation

## 2020-07-11 NOTE — Anesthesia Preprocedure Evaluation (Addendum)
Anesthesia Evaluation  Patient identified by MRN, date of birth, ID band Patient awake    Reviewed: Allergy & Precautions, NPO status , Patient's Chart, lab work & pertinent test results  History of Anesthesia Complications Negative for: history of anesthetic complications  Airway Mallampati: III   Neck ROM: Full    Dental no notable dental hx.    Pulmonary neg pulmonary ROS,    Pulmonary exam normal breath sounds clear to auscultation       Cardiovascular Exercise Tolerance: Good hypertension, Normal cardiovascular exam Rhythm:Regular Rate:Normal     Neuro/Psych negative neurological ROS     GI/Hepatic negative GI ROS,   Endo/Other  negative endocrine ROS  Renal/GU Renal disease (nephrolithiasis)     Musculoskeletal   Abdominal   Peds  Hematology negative hematology ROS (+)   Anesthesia Other Findings   Reproductive/Obstetrics                            Anesthesia Physical Anesthesia Plan  ASA: II  Anesthesia Plan: General and Regional   Post-op Pain Management:  Regional for Post-op pain and GA combined w/ Regional for post-op pain   Induction: Intravenous  PONV Risk Score and Plan: 2 and Dexamethasone, Ondansetron and Treatment may vary due to age or medical condition  Airway Management Planned: Oral ETT  Additional Equipment:   Intra-op Plan:   Post-operative Plan: Extubation in OR  Informed Consent: I have reviewed the patients History and Physical, chart, labs and discussed the procedure including the risks, benefits and alternatives for the proposed anesthesia with the patient or authorized representative who has indicated his/her understanding and acceptance.       Plan Discussed with: CRNA  Anesthesia Plan Comments:        Anesthesia Quick Evaluation

## 2020-07-12 ENCOUNTER — Encounter: Payer: Self-pay | Admitting: Orthopedic Surgery

## 2020-07-12 ENCOUNTER — Ambulatory Visit: Payer: Medicare Other | Admitting: Anesthesiology

## 2020-07-12 ENCOUNTER — Encounter
Admission: RE | Disposition: A | Discharge status: Home / Self Care | Payer: Self-pay | Source: Home / Self Care | Attending: Orthopedic Surgery

## 2020-07-12 ENCOUNTER — Ambulatory Visit
Admission: RE | Admit: 2020-07-12 | Discharge: 2020-07-12 | Disposition: A | Discharge status: Home / Self Care | Payer: Medicare Other | Attending: Orthopedic Surgery | Admitting: Orthopedic Surgery

## 2020-07-12 ENCOUNTER — Other Ambulatory Visit: Payer: Self-pay

## 2020-07-12 DIAGNOSIS — X58XXXA Exposure to other specified factors, initial encounter: Secondary | ICD-10-CM | POA: Insufficient documentation

## 2020-07-12 DIAGNOSIS — Z791 Long term (current) use of non-steroidal anti-inflammatories (NSAID): Secondary | ICD-10-CM | POA: Insufficient documentation

## 2020-07-12 DIAGNOSIS — M25811 Other specified joint disorders, right shoulder: Secondary | ICD-10-CM | POA: Diagnosis not present

## 2020-07-12 DIAGNOSIS — S46111A Strain of muscle, fascia and tendon of long head of biceps, right arm, initial encounter: Secondary | ICD-10-CM | POA: Diagnosis not present

## 2020-07-12 DIAGNOSIS — M75101 Unspecified rotator cuff tear or rupture of right shoulder, not specified as traumatic: Secondary | ICD-10-CM | POA: Insufficient documentation

## 2020-07-12 DIAGNOSIS — Z79899 Other long term (current) drug therapy: Secondary | ICD-10-CM | POA: Insufficient documentation

## 2020-07-12 LAB — SARS CORONAVIRUS 2 (TAT 6-24 HRS): SARS Coronavirus 2: NEGATIVE

## 2020-07-12 SURGERY — SHOULDER ARTHROSCOPY WITH SUBACROMIAL DECOMPRESSION AND DISTAL CLAVICLE EXCISION
Anesthesia: Regional | Site: Shoulder | Laterality: Right

## 2020-07-12 MED ORDER — MIDAZOLAM HCL 5 MG/5ML IJ SOLN
INTRAMUSCULAR | Status: DC | PRN
  Administered 2020-07-12: 2 mg via INTRAVENOUS

## 2020-07-12 MED ORDER — ONDANSETRON 4 MG PO TBDP: 4.0000 mg | ORAL_TABLET | Freq: Three times a day (TID) | ORAL | 0 refills | Status: DC | PRN

## 2020-07-12 MED ORDER — EPHEDRINE SULFATE 50 MG/ML IJ SOLN
INTRAMUSCULAR | Status: DC | PRN
  Administered 2020-07-12 (×5): 5 mg via INTRAVENOUS
  Administered 2020-07-12 (×2): 10 mg via INTRAVENOUS

## 2020-07-12 MED ORDER — LACTATED RINGERS IV SOLN: INTRAVENOUS | Status: DC

## 2020-07-12 MED ORDER — CEFAZOLIN SODIUM-DEXTROSE 2-4 GM/100ML-% IV SOLN
2.0000 g | INTRAVENOUS | Status: AC
  Administered 2020-07-12: 11:00:00 2 g via INTRAVENOUS

## 2020-07-12 MED ORDER — BUPIVACAINE HCL (PF) 0.5 % IJ SOLN
INTRAMUSCULAR | Status: DC | PRN
  Administered 2020-07-12: 10 mL

## 2020-07-12 MED ORDER — DEXAMETHASONE SODIUM PHOSPHATE 4 MG/ML IJ SOLN
INTRAMUSCULAR | Status: DC | PRN
  Administered 2020-07-12: 4 mg via INTRAVENOUS

## 2020-07-12 MED ORDER — ACETAMINOPHEN 500 MG PO TABS: 1000.0000 mg | ORAL_TABLET | Freq: Three times a day (TID) | ORAL | 2 refills | Status: AC

## 2020-07-12 MED ORDER — OXYCODONE HCL 5 MG PO TABS: 5.0000 mg | ORAL_TABLET | ORAL | 0 refills | Status: DC | PRN

## 2020-07-12 MED ORDER — PROPOFOL 10 MG/ML IV BOLUS
INTRAVENOUS | Status: DC | PRN
  Administered 2020-07-12: 180 mg via INTRAVENOUS

## 2020-07-12 MED ORDER — BUPIVACAINE LIPOSOME 1.3 % IJ SUSP
INTRAMUSCULAR | Status: DC | PRN
  Administered 2020-07-12: 20 mL

## 2020-07-12 MED ORDER — FENTANYL CITRATE (PF) 100 MCG/2ML IJ SOLN
INTRAMUSCULAR | Status: DC | PRN
  Administered 2020-07-12: 50 ug via INTRAVENOUS

## 2020-07-12 MED ORDER — LACTATED RINGERS IV SOLN
INTRAVENOUS | Status: DC | PRN
  Administered 2020-07-12 (×3): 6000 mL
  Administered 2020-07-12: 12:00:00 12000 mL

## 2020-07-12 MED ORDER — ASPIRIN EC 325 MG PO TBEC: 325.0000 mg | DELAYED_RELEASE_TABLET | Freq: Every day | ORAL | 0 refills | Status: AC

## 2020-07-12 MED ORDER — ONDANSETRON HCL 4 MG/2ML IJ SOLN
INTRAMUSCULAR | Status: DC | PRN
  Administered 2020-07-12: 4 mg via INTRAVENOUS

## 2020-07-12 SURGICAL SUPPLY — 64 items
ADAPTER IRRIG TUBE 2 SPIKE SOL (ADAPTER) ×6 IMPLANT
ADH SKN CLS APL DERMABOND .7 (GAUZE/BANDAGES/DRESSINGS) ×1
ADPR TBG 2 SPK PMP STRL ASCP (ADAPTER) ×2
ANCH SUT SWLK 19.1X4.75 (Anchor) ×4 IMPLANT
ANCHOR ICONIX SPEED 2.3 (Anchor) ×2 IMPLANT
ANCHOR ICONIX SPEED 2.3MM (Anchor) ×2 IMPLANT
ANCHOR SUT BIO SW 4.75X19.1 (Anchor) ×8 IMPLANT
APL PRP STRL LF DISP 70% ISPRP (MISCELLANEOUS) ×1
BUR BR 5.5 12 FLUTE (BURR) ×3 IMPLANT
BUR RADIUS 4.0X18.5 (BURR) ×3 IMPLANT
CANNULA 5.75X7CM (CANNULA) ×1
CANNULA PART THRD DISP 5.75X7 (CANNULA) ×2 IMPLANT
CHLORAPREP W/TINT 26 (MISCELLANEOUS) ×3 IMPLANT
COOLER POLAR GLACIER W/PUMP (MISCELLANEOUS) ×3 IMPLANT
COVER LIGHT HANDLE UNIVERSAL (MISCELLANEOUS) ×6 IMPLANT
DERMABOND ADVANCED (GAUZE/BANDAGES/DRESSINGS) ×2
DERMABOND ADVANCED .7 DNX12 (GAUZE/BANDAGES/DRESSINGS) IMPLANT
DRAPE IMP U-DRAPE 54X76 (DRAPES) ×6 IMPLANT
DRAPE INCISE IOBAN 66X45 STRL (DRAPES) ×3 IMPLANT
DRAPE U-SHAPE 48X52 POLY STRL (PACKS) ×5 IMPLANT
DRSG TEGADERM 4X4.75 (GAUZE/BANDAGES/DRESSINGS) ×10 IMPLANT
ELECT REM PT RETURN 9FT ADLT (ELECTROSURGICAL) ×3
ELECTRODE REM PT RTRN 9FT ADLT (ELECTROSURGICAL) IMPLANT
GAUZE XEROFORM 1X8 LF (GAUZE/BANDAGES/DRESSINGS) ×3 IMPLANT
GLOVE BIO SURGEON STRL SZ7.5 (GLOVE) ×6 IMPLANT
GLOVE BIOGEL PI IND STRL 8 (GLOVE) ×1 IMPLANT
GLOVE BIOGEL PI INDICATOR 8 (GLOVE) ×2
GOWN STRL REIN 2XL XLG LVL4 (GOWN DISPOSABLE) ×3 IMPLANT
GOWN STRL REUS W/ TWL LRG LVL3 (GOWN DISPOSABLE) ×1 IMPLANT
GOWN STRL REUS W/TWL LRG LVL3 (GOWN DISPOSABLE) ×3
IV LACTATED RINGER IRRG 3000ML (IV SOLUTION) ×24
IV LR IRRIG 3000ML ARTHROMATIC (IV SOLUTION) ×8 IMPLANT
KIT STABILIZATION SHOULDER (MISCELLANEOUS) ×3 IMPLANT
KIT TURNOVER KIT A (KITS) ×3 IMPLANT
MANIFOLD NEPTUNE II (INSTRUMENTS) ×3 IMPLANT
MASK FACE SPIDER DISP (MASK) ×3 IMPLANT
MAT GRAY ABSORB FLUID 28X50 (MISCELLANEOUS) ×6 IMPLANT
NDL SAFETY ECLIPSE 18X1.5 (NEEDLE) ×1 IMPLANT
NDL SCORPION MULTI FIRE (NEEDLE) IMPLANT
NEEDLE HYPO 18GX1.5 SHARP (NEEDLE) ×3
NEEDLE SCORPION MULTI FIRE (NEEDLE) ×3 IMPLANT
PACK ARTHROSCOPY SHOULDER (MISCELLANEOUS) ×3 IMPLANT
PAD WRAPON POLAR SHDR UNIV (MISCELLANEOUS) ×1 IMPLANT
PENCIL SMOKE EVACUATOR (MISCELLANEOUS) ×2 IMPLANT
SET TUBE SUCT SHAVER OUTFL 24K (TUBING) ×3 IMPLANT
SET TUBE TIP INTRA-ARTICULAR (MISCELLANEOUS) ×3 IMPLANT
SPONGE GAUZE 2X2 8PLY STER LF (GAUZE/BANDAGES/DRESSINGS) ×3
SPONGE GAUZE 2X2 8PLY STRL LF (GAUZE/BANDAGES/DRESSINGS) ×7 IMPLANT
SUT ETHILON 3-0 FS-10 30 BLK (SUTURE) ×3
SUT MNCRL 4-0 (SUTURE) ×3
SUT MNCRL 4-0 27XMFL (SUTURE) ×1
SUT VIC AB 0 CT1 36 (SUTURE) ×2 IMPLANT
SUT VIC AB 2-0 SH 27 (SUTURE) ×3
SUT VIC AB 2-0 SH 27XBRD (SUTURE) IMPLANT
SUTURE EHLN 3-0 FS-10 30 BLK (SUTURE) IMPLANT
SUTURE MNCRL 4-0 27XMF (SUTURE) IMPLANT
SUTURE TAPE 1.3 40 TPR END (SUTURE) IMPLANT
SUTURETAPE 1.3 40 TPR END (SUTURE) ×6
SYR 10ML LL (SYRINGE) ×3 IMPLANT
SYSTEM FBRTK BICEPS 1.9 DRILL (Anchor) ×2 IMPLANT
TAPE MICROFOAM 4IN (TAPE) ×3 IMPLANT
TUBING ARTHRO INFLOW-ONLY STRL (TUBING) ×3 IMPLANT
WAND WEREWOLF FLOW 90D (MISCELLANEOUS) ×3 IMPLANT
WRAPON POLAR PAD SHDR UNIV (MISCELLANEOUS) ×3

## 2020-07-12 NOTE — Transfer of Care (Signed)
Immediate Anesthesia Transfer of Care Note  Patient: Erik Moore  Procedure(s) Performed: Right shoulder arthroscopic subscapularis Repair, vs mini-open Supraspinatus, subacromial decompression, and biceps tenodesis (Right Shoulder)  Patient Location: PACU  Anesthesia Type: General, Regional  Level of Consciousness: awake, alert  and patient cooperative  Airway and Oxygen Therapy: Patient Spontanous Breathing and Patient connected to supplemental oxygen  Post-op Assessment: Post-op Vital signs reviewed, Patient's Cardiovascular Status Stable, Respiratory Function Stable, Patent Airway and No signs of Nausea or vomiting  Post-op Vital Signs: Reviewed and stable  Complications: No complications documented.

## 2020-07-12 NOTE — H&P (Signed)
Paper H&P to be scanned into permanent record. H&P reviewed. No significant changes noted.  

## 2020-07-12 NOTE — Op Note (Addendum)
SURGERY DATE: 07/12/2020  PRE-OP DIAGNOSIS:  1. Right rotator cuff tear (subscapularis, supraspinatus) 2. Right subacromial impingement 3. Right biceps tendon split-thickness tear  POST-OP DIAGNOSIS: 1. Right rotator cuff tear (subscapularis, supraspinatus) 2. Right subacromial impingement 3. Right biceps tendon split-thickness tear  PROCEDURES:  1. Right arthroscopic rotator cuff repair (subscapularis) 2. Right mini-open rotator cuff repair (supraspinatus) 3. Right open biceps tenodesis 4. Right arthroscopic extensive debridement of shoulder (glenohumeral and subacromial spaces) 5. Right arthroscopic subacromial decompression  SURGEON: Rosealee Albee, MD  ANESTHESIA: Gen with Exparil interscalene block  ESTIMATED BLOOD LOSS: 25cc  DRAINS:  none  TOTAL IV FLUIDS: per anesthesia   SPECIMENS: none  IMPLANTS:  - Arthrex 4.25mm SwiveLock x 4 - Arthrex FiberTak Suture Anchor - Double Loaded - Iconix SPEED double loaded with 1.2 and 2.56mm tape x 1   OPERATIVE FINDINGS:  Examination under anesthesia: A careful examination under anesthesia was performed.  Passive range of motion was: FF: 150; ER at side: 45; ER in abduction: 90; IR in abduction: 50.  Anterior load shift: NT.  Posterior load shift: NT.  Sulcus in neutral: NT.  Sulcus in ER: NT.    Intra-operative findings: A thorough arthroscopic examination of the shoulder was performed.  The findings are: 1. Biceps tendon: Significant split thickness tearing of the entire intra-articular portion 2. Superior labrum: injected with surrounding synovitis 3. Posterior labrum and capsule: normal 4. Inferior capsule and inferior recess: normal 5. Glenoid cartilage surface: Normal 6. Supraspinatus attachment: full-thickness tear 7. Posterior rotator cuff attachment: Normal 8. Humeral head articular cartilage: normal 9. Rotator interval: significant synovitis 10: Subscapularis tendon: full-thickness tear of the superior  subscapularis 11. Anterior labrum: degenerative 12. IGHL: significant synovitis around IGHL  OPERATIVE REPORT:   Indications for procedure: Erik Moore is a 64 y.o. male with over 1 year of shoulder pain.  He has undergone extensive non-operative management including activity modification, physical therapy, corticosteroid injections, and medical management without adequate relief of symptoms. Clinical exam and MRI were suggestive of rotator cuff tear including subscapularis and supraspinatus; biceps tendinopathy/tearing; and subacromial impingement. After discussion of risks, benefits, and alternatives to surgery, the patient elected to proceed.   Procedure in detail:  I identified Erik Moore in the pre-operative holding area.  I marked the operative shoulder with my initials. I reviewed the risks and benefits of the proposed surgical intervention, and the patient (and/or patient's guardian) wished to proceed.  Anesthesia was then performed with an Exparil interscalene block.  The patient was transferred to the operative suite and placed in the beach chair position.    SCDs were placed on the lower extremities. Appropriate IV antibiotics were administered prior to incision. The operative upper extremity was then prepped and draped in standard fashion. A time out was performed confirming the correct extremity, correct patient, and correct procedure.   I then created a standard posterior portal with an 11 blade. The glenohumeral joint was easily entered with a blunt trochar and the arthroscope introduced. The findings of diagnostic arthroscopy are described above.  A standard anterior portal was made.  I debrided degenerative tissue including the synovitic tissue about the rotator interval and IGHL as well as the anterior and superior labrum. I then coagulated the inflamed synovium to obtain hemostasis and reduce the risk of post-operative swelling using an Arthrocare radiofrequency device. The  biceps tendon was cut at its insertion on the superior labrum with arthroscopic scissors.  The subscapularis tear was identified.  A superior anterolateral portal was  made under needle localization.  A 7 mm cannula was placed.  The subscapularis was not significantly retracted and could be easily mobilized back to its footprint.  Rotator interval tissue was appropriately debrided to allow for adequate visualization.  Tissue about the subscapularis was released anteriorly, superiorly, and posteriorly to allow for improved mobilization.  A 70 degree arthroscope was utilized for improved visualization.  The lesser tuberosity footprint was prepared with a combination of electrocautery and an arthroscopic curette.  A Scorpion suture passing device was used to pass 2 SutureTapes in a mattress fashion through the subscapularis tendon.  These were passed from the anterolateral portal and after passage the sutures were retrieved from the anterior portal.  These were passed through a 4.75 mm SwiveLock and placed into the lesser tuberosity at the footprint of the subscapularis tendon with the arm in a neutral position.  This appropriately reduced the subscapularis tear.  The arm was then internally and externally rotated and the subscapularis was noted to move appropriately with rotation.  Next, the arthroscope was then introduced into the subacromial space. A direct lateral portal was created with an 11-blade after spinal needle localization. An extensive subacromial bursectomy was performed using a combination of the shaver and Arthrocare wand. The entire acromial undersurface was exposed and the CA ligament was subperiosteally elevated to expose the anterior acromial hook. A 5.75mm barrel burr was used to create a flat anterior and lateral aspect of the acromion, converting it from a Type 2 to a Type 1 acromion. Care was made to keep the deltoid fascia intact.  A longitudinal incision from the anterolateral acromion  ~6cm in length was made overlying the raphe between the anterior and middle heads of the deltoid.  This incision also incorporated the anterolateral portal.  The raphe was identified and it was incised. The subacromial space was identified. Any remaining bursa was excised. The rotator cuff tear involving the supraspinatus and part of the infraspinatus was identified. It was a U-shaped tear that could easily be mobilized to its footprint.  We then turned our attention to the biceps tenodesis. The arm was externally rotated.  The bicipital groove was identified.  A 15 blade was used to make a cut overlying the biceps tendon, and the tendon was removed using a right angle clamp.  The base of the bicipital groove was identified and cleared of soft tissue.  A FiberTak anchor was placed in the bicipital groove.  The biceps tendon was held at the appropriate amount of tension.  One set of sutures was passed through the biceps anchor with one limb passed in a simple fashion and the second limb passed in a simple plus locking stitch pattern.  This was repeated for the other set of sutures.  This construct allowed for shuttling the biceps tendon down to the bone.  The sutures were tied and cut.  The diseased portion of the proximal biceps was then excised.  The arm was then internally rotated.  The rotator cuff footprint was cleared of soft tissue and the footprint was smoothed and bleeding bone over the footprint was created with a rongeur.  Two Iconix SPEED anchors were placed just lateral to the articular margin.  The posterior anchor pulled out of the bone ends of the sutures from this anchor were passed through a 4.18mm SwiveLock anchor and placed into the same posterior hole.  This achieved appropriate fixation.  The greater tuberosity footprint was additionally vented by impacting the SPEED anchor inserter to create  channels for marrow elements to assist with healing.  The rotator cuff was held in a reduced  position and all limbs of suture were passed appropriately with a scorpion suture passer. Two SwiveLock anchors were placed for the lateral row anchors with one limb of each of the four medial row sutures passed through each anchor.  There was a small dogear posteriorly and the FiberWire sutures from the posterior lateral row anchor were passed in a mattress fashion through the dog ear.  The suture was tied, appropriately reducing the dog ear.  This construct allowed for excellent reapproximation and compression of the rotator cuff over its footprint without undue tension. The construct was stable with external and internal rotation.  The wound was thoroughly irrigated.  The deltoid split was closed with 0 Vicryl.  The subdermal layer was closed with 2-0 Vicryl.  The skin was closed with 4-0 Monocryl and Dermabond. The portals were closed with 3-0 Nylon. Xeroform was applied to the incisions. A sterile dressing was applied, followed by a Polar Care sleeve and a SlingShot shoulder immobilizer/sling. The patient was awakened from anesthesia without difficulty and was transferred to the PACU in stable condition.    COMPLICATIONS: none  DISPOSITION: plan for discharge home after recovery in PACU   POSTOPERATIVE PLAN: Remain in sling (except hygiene and elbow/wrist/hand RoM exercises as instructed by PT) x 6 weeks and NWB for this time.  Use large rotator cuff repair with subscapularis repair rehab protocol. PT to begin within 1 week of surgery.  ASA 325mg  daily x 2 weeks for DVT ppx.

## 2020-07-12 NOTE — Anesthesia Postprocedure Evaluation (Signed)
Anesthesia Post Note  Patient: Imri Lor  Procedure(s) Performed: Right shoulder arthroscopic subscapularis Repair, vs mini-open Supraspinatus, subacromial decompression, and biceps tenodesis (Right Shoulder)     Patient location during evaluation: PACU Anesthesia Type: Regional Level of consciousness: awake and alert, oriented and patient cooperative Pain management: pain level controlled Vital Signs Assessment: post-procedure vital signs reviewed and stable Respiratory status: spontaneous breathing, nonlabored ventilation and respiratory function stable Cardiovascular status: blood pressure returned to baseline and stable Postop Assessment: adequate PO intake Anesthetic complications: no   No complications documented.  Reed Breech

## 2020-07-12 NOTE — Anesthesia Procedure Notes (Signed)
Anesthesia Regional Block: Interscalene brachial plexus block   Pre-Anesthetic Checklist: ,, timeout performed, Correct Patient, Correct Site, Correct Laterality, Correct Procedure, Correct Position, site marked, Risks and benefits discussed,  Surgical consent,  Pre-op evaluation,  At surgeon's request and post-op pain management  Laterality: Right  Prep: chloraprep       Needles:  Injection technique: Single-shot  Needle Type: Stimiplex     Needle Length: 10cm  Needle Gauge: 21     Additional Needles:   Procedures:,,,, ultrasound used (permanent image in chart),,,,  Narrative:  Start time: 07/12/2020 9:47 AM End time: 07/12/2020 9:55 AM Injection made incrementally with aspirations every 5 mL.  Performed by: Personally  Anesthesiologist: Reed Breech, MD  Additional Notes: Functioning IV was confirmed and monitors applied. Ultrasound guidance: relevant anatomy identified, needle position confirmed, local anesthetic spread visualized around nerve(s), vascular puncture avoided.  Image printed for medical record.  Negative aspiration and no paresthesias; incremental administration of local anesthetic. The patient tolerated the procedure well. Vital signs recorded in RN notes.

## 2020-07-12 NOTE — Discharge Instructions (Signed)
Post-Op Instructions - Rotator Cuff Repair  1. Bracing: You will wear a shoulder immobilizer or sling for 6 weeks.   2. Driving: No driving for 3 weeks post-op. When driving, do not wear the immobilizer. Ideally, we recommend no driving for 6 weeks while sling is in place as one arm will be immobilized.   3. Activity: No active lifting for 2 months. Wrist, hand, and elbow motion only. Avoid lifting the upper arm away from the body except for hygiene. You are permitted to bend and straighten the elbow passively only (no active elbow motion). You may use your hand and wrist for typing, writing, and managing utensils (cutting food). Do not lift more than a coffee cup for 8 weeks.  When sleeping or resting, inclined positions (recliner chair or wedge pillow) and a pillow under the forearm for support may provide better comfort for up to 4 weeks.  Avoid long distance travel for 4 weeks.  Return to normal activities after rotator cuff repair repair normally takes 6 months on average. If rehab goes very well, may be able to do most activities at 4 months, except overhead or contact sports.  4. Physical Therapy: Begins 3-4 days after surgery, and proceed 1 time per week for the first 6 weeks, then 1-2 times per week from weeks 6-20 post-op.  5. Medications:  - You will be provided a prescription for narcotic pain medicine. After surgery, take 1-2 narcotic tablets every 4 hours if needed for severe pain.  - A prescription for anti-nausea medication will be provided in case the narcotic medicine causes nausea - take 1 tablet every 6 hours only if nauseated.   - Take tylenol 1000 mg (2 Extra Strength tablets or 3 regular strength) every 8 hours for pain.  May decrease or stop tylenol 5 days after surgery if you are having minimal pain. - Take ASA 325mg/day x 2 weeks to help prevent DVTs/PEs (blood clots).  - DO NOT take ANY nonsteroidal anti-inflammatory pain medications (Advil, Motrin, Ibuprofen, Aleve,  Naproxen, or Naprosyn). These medicines can inhibit healing of your shoulder repair.    If you are taking prescription medication for anxiety, depression, insomnia, muscle spasm, chronic pain, or for attention deficit disorder, you are advised that you are at a higher risk of adverse effects with use of narcotics post-op, including narcotic addiction/dependence, depressed breathing, death. If you use non-prescribed substances: alcohol, marijuana, cocaine, heroin, methamphetamines, etc., you are at a higher risk of adverse effects with use of narcotics post-op, including narcotic addiction/dependence, depressed breathing, death. You are advised that taking > 50 morphine milligram equivalents (MME) of narcotic pain medication per day results in twice the risk of overdose or death. For your prescription provided: oxycodone 5 mg - taking more than 6 tablets per day would result in > 50 morphine milligram equivalents (MME) of narcotic pain medication. Be advised that we will prescribe narcotics short-term, for acute post-operative pain only - 3 weeks for major operations such as shoulder repair/reconstruction surgeries.     6. Post-Op Appointment:  Your first post-op appointment will be 10-14 days post-op.  7. Work or School: For most, but not all procedures, we advise staying out of work or school for at least 1 to 2 weeks in order to recover from the stress of surgery and to allow time for healing.   If you need a work or school note this can be provided.   8. Smoking: If you are a smoker, you need to refrain from   smoking in the postoperative period. The nicotine in cigarettes will inhibit healing of your shoulder repair and decrease the chance of successful repair. Similarly, nicotine containing products (gum, patches) should be avoided.   Post-operative Brace: Apply and remove the brace you received as you were instructed to at the time of fitting and as described in detail as the brace's  instructions for use indicate.  Wear the brace for the period of time prescribed by your physician.  The brace can be cleaned with soap and water and allowed to air dry only.  Should the brace result in increased pain, decreased feeling (numbness/tingling), increased swelling or an overall worsening of your medical condition, please contact your doctor immediately.  If an emergency situation occurs as a result of wearing the brace after normal business hours, please dial 911 and seek immediate medical attention.  Let your doctor know if you have any further questions about the brace issued to you. Refer to the shoulder sling instructions for use if you have any questions regarding the correct fit of your shoulder sling.  [employer] Customer Care for Troubleshooting: [phone]  Video that illustrates how to properly use a shoulder sling: "Instructions for Proper Use of an Orthopaedic Sling" https://www.youtube.com/watch?v=AHZpn_Xo45w        General Anesthesia, Adult, Care After This sheet gives you information about how to care for yourself after your procedure. Your health care provider may also give you more specific instructions. If you have problems or questions, contact your health care provider. What can I expect after the procedure? After the procedure, the following side effects are common:  Pain or discomfort at the IV site.  Nausea.  Vomiting.  Sore throat.  Trouble concentrating.  Feeling cold or chills.  Weak or tired.  Sleepiness and fatigue.  Soreness and body aches. These side effects can affect parts of the body that were not involved in surgery. Follow these instructions at home:  For at least 24 hours after the procedure:  Have a responsible adult stay with you. It is important to have someone help care for you until you are awake and alert.  Rest as needed.  Do not: ? Participate in activities in which you could fall or become injured. ? Drive. ? Use  heavy machinery. ? Drink alcohol. ? Take sleeping pills or medicines that cause drowsiness. ? Make important decisions or sign legal documents. ? Take care of children on your own. Eating and drinking  Follow any instructions from your health care provider about eating or drinking restrictions.  When you feel hungry, start by eating small amounts of foods that are soft and easy to digest (bland), such as toast. Gradually return to your regular diet.  Drink enough fluid to keep your urine pale yellow.  If you vomit, rehydrate by drinking water, juice, or clear broth. General instructions  If you have sleep apnea, surgery and certain medicines can increase your risk for breathing problems. Follow instructions from your health care provider about wearing your sleep device: ? Anytime you are sleeping, including during daytime naps. ? While taking prescription pain medicines, sleeping medicines, or medicines that make you drowsy.  Return to your normal activities as told by your health care provider. Ask your health care provider what activities are safe for you.  Take over-the-counter and prescription medicines only as told by your health care provider.  If you smoke, do not smoke without supervision.  Keep all follow-up visits as told by your health care provider. This   is important. Contact a health care provider if:  You have nausea or vomiting that does not get better with medicine.  You cannot eat or drink without vomiting.  You have pain that does not get better with medicine.  You are unable to pass urine.  You develop a skin rash.  You have a fever.  You have redness around your IV site that gets worse. Get help right away if:  You have difficulty breathing.  You have chest pain.  You have blood in your urine or stool, or you vomit blood. Summary  After the procedure, it is common to have a sore throat or nausea. It is also common to feel tired.  Have a  responsible adult stay with you for the first 24 hours after general anesthesia. It is important to have someone help care for you until you are awake and alert.  When you feel hungry, start by eating small amounts of foods that are soft and easy to digest (bland), such as toast. Gradually return to your regular diet.  Drink enough fluid to keep your urine pale yellow.  Return to your normal activities as told by your health care provider. Ask your health care provider what activities are safe for you. This information is not intended to replace advice given to you by your health care provider. Make sure you discuss any questions you have with your health care provider. Document Revised: 07/05/2017 Document Reviewed: 02/15/2017 [employer] Patient Education  2020 [employer]  

## 2020-07-12 NOTE — Anesthesia Procedure Notes (Signed)
Procedure Name: LMA Insertion Date/Time: 07/12/2020 10:58 AM Performed by: Maree Krabbe, CRNA Pre-anesthesia Checklist: Patient identified, Emergency Drugs available, Suction available, Timeout performed and Patient being monitored Patient Re-evaluated:Patient Re-evaluated prior to induction Oxygen Delivery Method: Circle system utilized Preoxygenation: Pre-oxygenation with 100% oxygen Induction Type: IV induction LMA: LMA inserted LMA Size: 5.0 Number of attempts: 1 Placement Confirmation: positive ETCO2 and breath sounds checked- equal and bilateral Tube secured with: Tape Dental Injury: Teeth and Oropharynx as per pre-operative assessment

## 2021-01-22 IMAGING — MR MR LUMBAR SPINE W/O CM
5 series · 31 of 48 positions shown · non-contrast
Comparison: None.

CLINICAL DATA: Left-sided sciatica

EXAM:
MRI LUMBAR SPINE WITHOUT CONTRAST
TECHNIQUE: Multiplanar, multisequence MR imaging of the lumbar spine was
performed. No intravenous contrast was administered.

[Series 5: T2 · sagittal · 4.0mm · 0.81mm/px · 6 of 17 slices shown (1 of 2)]
[im 1/17]
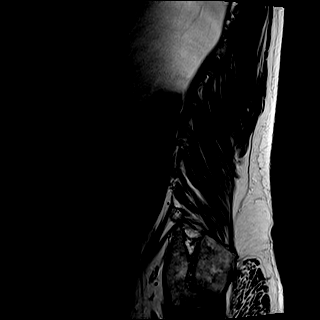
[im 4/17]
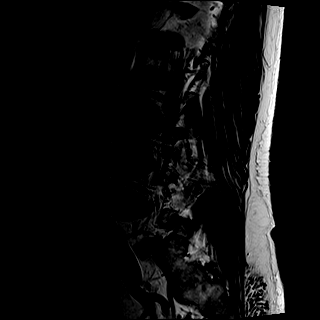
[im 7/17]
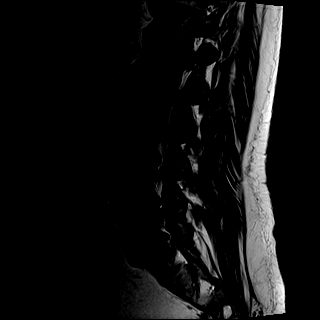
[im 10/17]
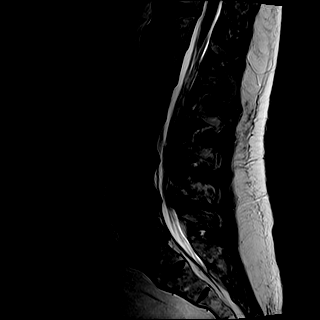
[im 13/17]
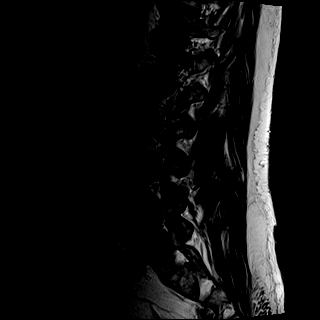
[im 17/17]
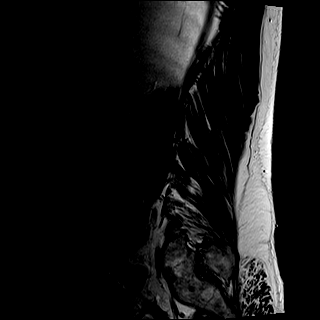

[Series 6: T1 · sagittal · 4.0mm · 0.81mm/px · 7 of 17 slices shown (1 of 2)]
[im 1/17]
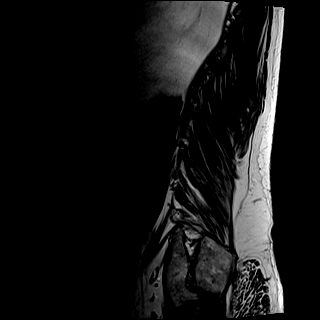
[im 3/17]
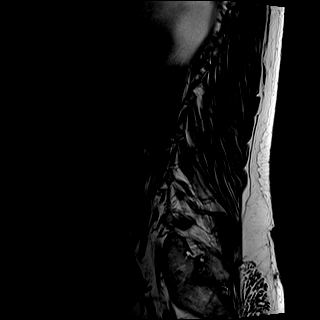
[im 6/17]
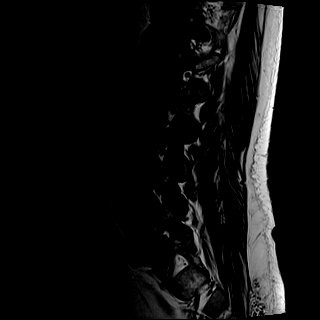
[im 9/17]
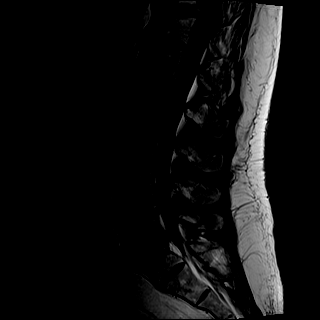
[im 11/17]
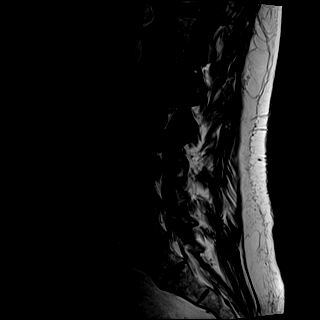
[im 14/17]
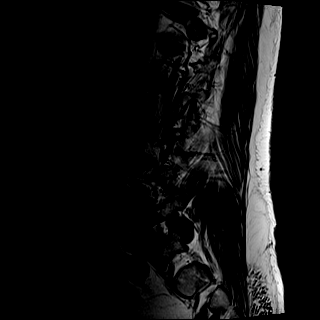
[im 17/17]
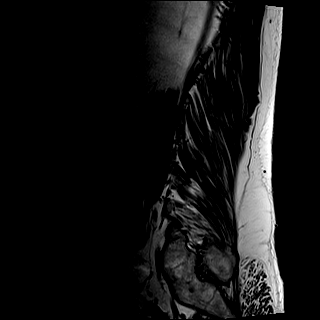

[Series 7: STIR · sagittal · 4.0mm · 0.41mm/px · 2 of 17 slices shown]
[im 1/17]
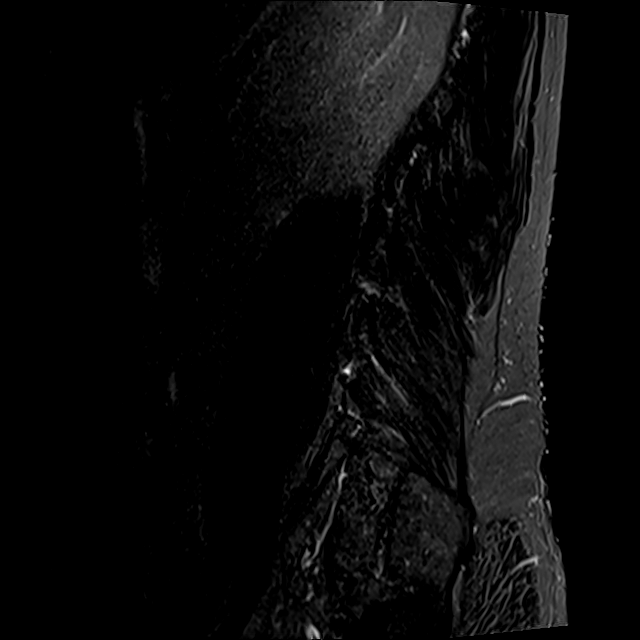
[im 3/17]
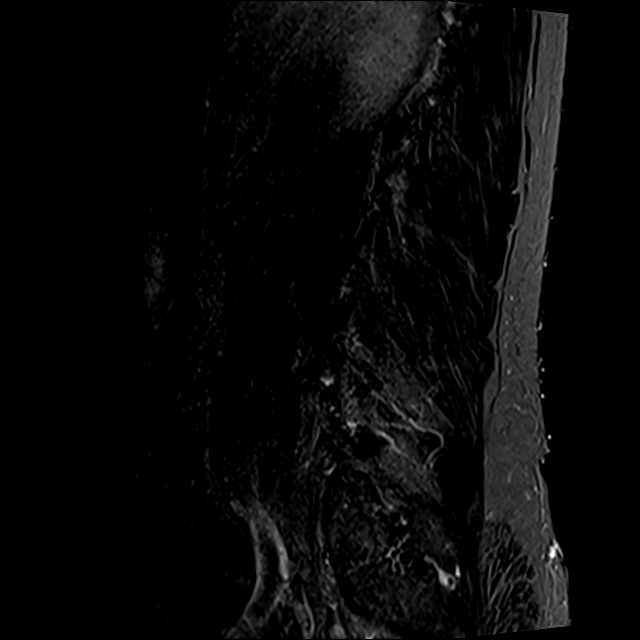

[Series 8: T2 · axial · 4.0mm · 0.78mm/px · z∈[-98,+128]mm · 8 of 36 slices shown (2 of 2)]
[im 1/36]
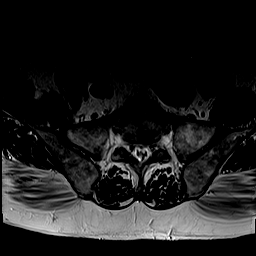
[im 6/36]
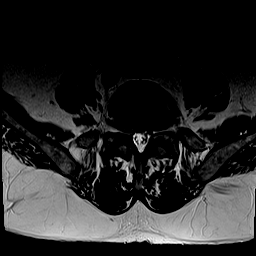
[im 11/36]
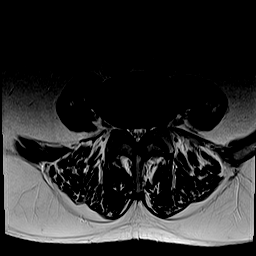
[im 17/36]
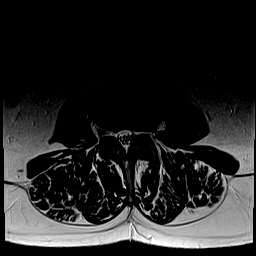
[im 19/36]
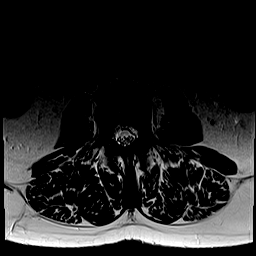
[im 25/36]
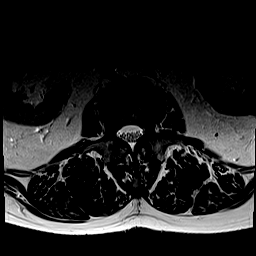
[im 30/36]
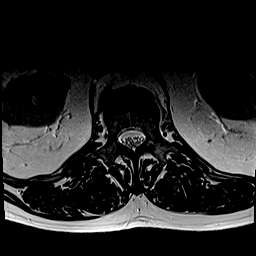
[im 36/36]
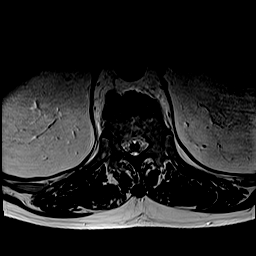

[Series 9: T1 · axial · 4.0mm · 0.39mm/px · z∈[-98,+128]mm · 8 of 36 slices shown (2 of 2)]
[im 1/36]
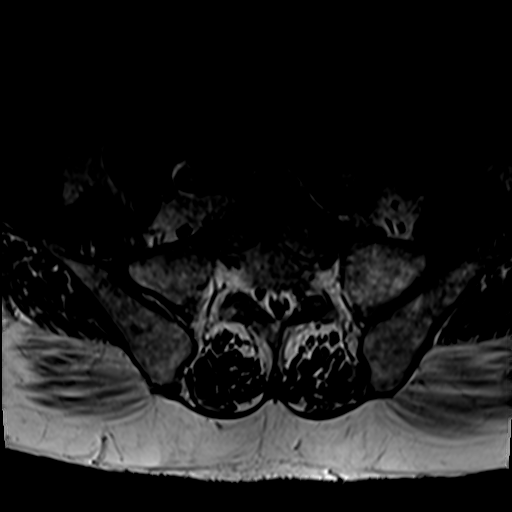
[im 6/36]
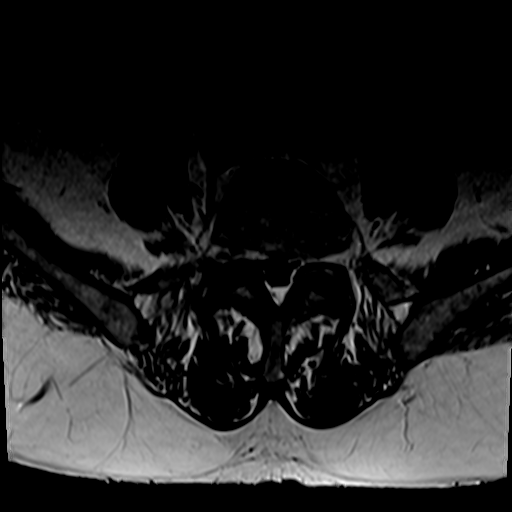
[im 11/36]
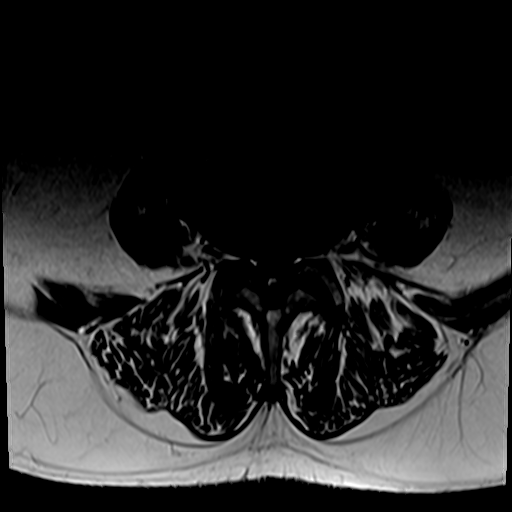
[im 17/36]
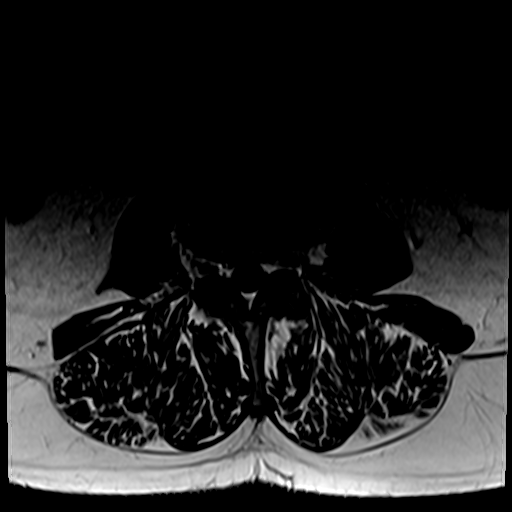
[im 19/36]
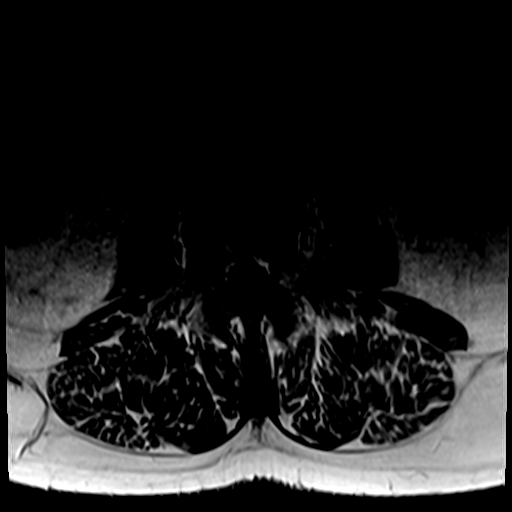
[im 25/36]
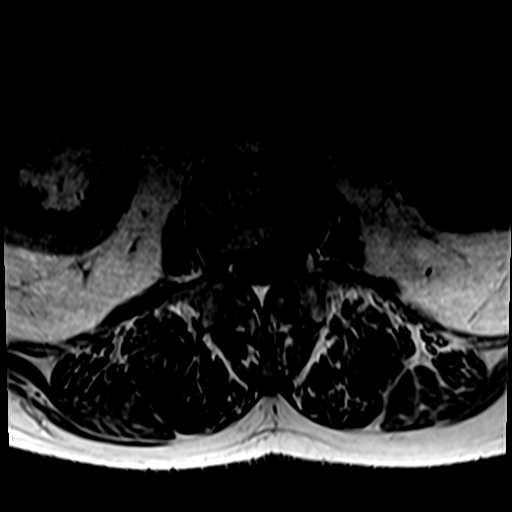
[im 30/36]
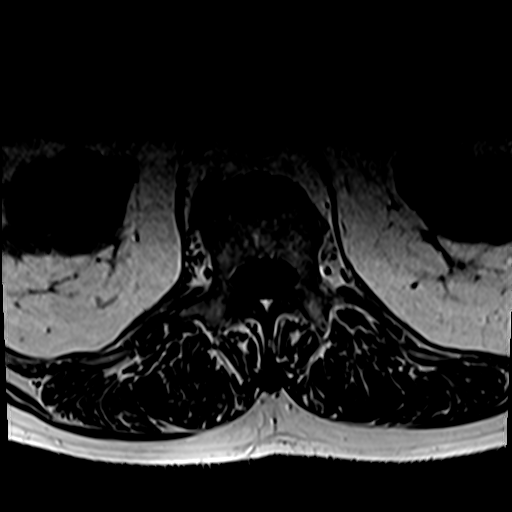
[im 36/36]
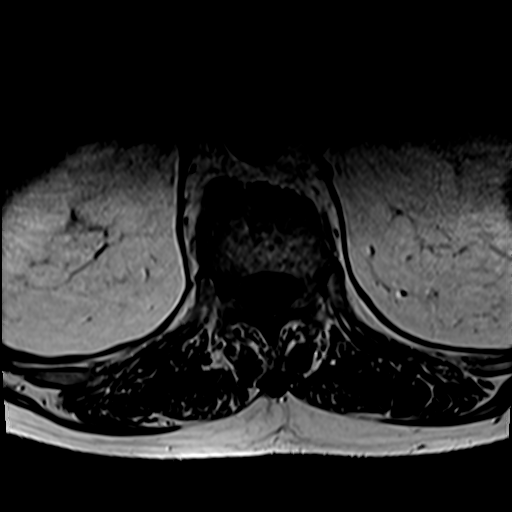

[31 of 48 positions shown; findings below may reference images not displayed]

FINDINGS: Segmentation:  Normal

Alignment:  Normal

Vertebrae:  No fracture, evidence of discitis, or bone lesion.

Conus medullaris and cauda equina: Conus extends to the L1 level.
Conus and cauda equina appear normal.

Paraspinal and other soft tissues: Negative

Disc levels:

T12-L1: Normal disc space and facets. No spinal canal or
neuroforaminal stenosis.

L1-L2: Normal disc space and facets. No spinal canal or
neuroforaminal stenosis.

L2-L3: Normal disc space and facets. No spinal canal or
neuroforaminal stenosis.

L3-L4: Left asymmetric disc bulge with mild left lateral recess
narrowing. No spinal canal stenosis. Mild left foraminal stenosis.

L4-L5: Circumferential disc bulge with narrowing of both lateral
recesses. No central spinal canal stenosis. No neural foraminal
stenosis. Normal facets.

L5-S1: Moderate facet hypertrophy. Mild disc bulge with endplate
spurring. No spinal canal stenosis. Mild bilateral foraminal
stenosis.

Visualized sacrum: Normal.
IMPRESSION: 1. Mild left lateral recess and foraminal stenosis at L3-L4.
2. L4-5 bilateral lateral recess narrowing, which could contribute
to L5 radiculopathy.
3. Moderate facet arthrosis at L5-S1 with mild bilateral foraminal
stenosis.
4. No spinal canal stenosis.

## 2021-03-13 ENCOUNTER — Other Ambulatory Visit: Payer: Self-pay | Admitting: Orthopedic Surgery

## 2021-04-04 ENCOUNTER — Other Ambulatory Visit: Payer: Self-pay

## 2021-04-04 ENCOUNTER — Encounter
Admission: RE | Admit: 2021-04-04 | Discharge: 2021-04-04 | Disposition: A | Discharge status: Home / Self Care | Payer: Medicare Other | Source: Ambulatory Visit | Attending: Orthopedic Surgery | Admitting: Orthopedic Surgery

## 2021-04-04 DIAGNOSIS — Z01818 Encounter for other preprocedural examination: Secondary | ICD-10-CM | POA: Insufficient documentation

## 2021-04-04 HISTORY — DX: Unspecified osteoarthritis, unspecified site: M19.90

## 2021-04-04 HISTORY — DX: Personal history of urinary calculi: Z87.442

## 2021-04-04 LAB — URINALYSIS, ROUTINE W REFLEX MICROSCOPIC
Bilirubin Urine: NEGATIVE
Glucose, UA: NEGATIVE mg/dL
Hgb urine dipstick: NEGATIVE
Ketones, ur: NEGATIVE mg/dL
Leukocytes,Ua: NEGATIVE
Nitrite: NEGATIVE
Protein, ur: NEGATIVE mg/dL
Specific Gravity, Urine: >1.03 — ABNORMAL HIGH (ref 1.005–1.030)
pH: 5.5 (ref 5.0–8.0)

## 2021-04-04 LAB — COMPREHENSIVE METABOLIC PANEL
ALT: 29 U/L (ref 0–44)
AST: 19 U/L (ref 15–41)
Albumin: 4.2 g/dL (ref 3.5–5.0)
Alkaline Phosphatase: 62 U/L (ref 38–126)
Anion gap: 7 (ref 5–15)
BUN: 32 mg/dL — ABNORMAL HIGH (ref 8–23)
CO2: 29 mmol/L (ref 22–32)
Calcium: 9.4 mg/dL (ref 8.9–10.3)
Chloride: 104 mmol/L (ref 98–111)
Creatinine, Ser: 1.06 mg/dL (ref 0.61–1.24)
GFR, Estimated: >60 mL/min (ref >60)
Glucose, Bld: 111 mg/dL — ABNORMAL HIGH (ref 70–99)
Potassium: 3.8 mmol/L (ref 3.5–5.1)
Sodium: 140 mmol/L (ref 135–145)
Total Bilirubin: 1.1 mg/dL (ref 0.3–1.2)
Total Protein: 7.1 g/dL (ref 6.5–8.1)

## 2021-04-04 LAB — CBC WITH DIFFERENTIAL/PLATELET
Abs Immature Granulocytes: 0.02 10*3/uL (ref 0.00–0.07)
Basophils Absolute: 0 10*3/uL (ref 0.0–0.1)
Basophils Relative: 1 %
Eosinophils Absolute: 0 10*3/uL (ref 0.0–0.5)
Eosinophils Relative: 1 %
HCT: 46 % (ref 39.0–52.0)
Hemoglobin: 15.8 g/dL (ref 13.0–17.0)
Immature Granulocytes: 0 %
Lymphocytes Relative: 26 %
Lymphs Abs: 1.3 10*3/uL (ref 0.7–4.0)
MCH: 31.9 pg (ref 26.0–34.0)
MCHC: 34.3 g/dL (ref 30.0–36.0)
MCV: 92.7 fL (ref 80.0–100.0)
Monocytes Absolute: 0.4 10*3/uL (ref 0.1–1.0)
Monocytes Relative: 9 %
Neutro Abs: 3.1 10*3/uL (ref 1.7–7.7)
Neutrophils Relative %: 63 %
Platelets: 144 10*3/uL — ABNORMAL LOW (ref 150–400)
RBC: 4.96 MIL/uL (ref 4.22–5.81)
RDW: 13.8 % (ref 11.5–15.5)
WBC: 4.8 10*3/uL (ref 4.0–10.5)
nRBC: 0 % (ref 0.0–0.2)

## 2021-04-04 LAB — TYPE AND SCREEN
ABO/RH(D): O NEG
Antibody Screen: NEGATIVE

## 2021-04-04 LAB — SURGICAL PCR SCREEN
MRSA, PCR: NEGATIVE
Staphylococcus aureus: NEGATIVE

## 2021-04-04 NOTE — Patient Instructions (Addendum)
Your procedure is scheduled on: Thursday April 13, 2021. Report to Day Surgery inside [facility] 2nd floor stop by admissions desk first before getting on elevator. To find out your arrival time please call [phone] between 1PM - 3PM on Wednesday April 12, 2021.  Remember: Instructions that are not followed completely may result in serious medical risk,  up to and including death, or upon the discretion of your surgeon and anesthesiologist your  surgery may need to be rescheduled.     _X__ 1. Do not eat food after midnight the night before your procedure.                 No chewing gum or hard candies. You may drink clear liquids up to 2 hours                 before you are scheduled to arrive for your surgery- DO not drink clear                 liquids within 2 hours of the start of your surgery.                 Clear Liquids include:  water, apple juice without pulp, clear Gatorade, G2 or                  Gatorade Zero (avoid Red/Purple/Blue), Black Coffee or Tea (Do not add                 anything to coffee or tea).  __X__2.   Complete the "Ensure Clear Pre-surgery Clear Carbohydrate Drink" provided to you, 2 hours before arrival. **If you are diabetic you will be provided with an alternative drink, Gatorade Zero or G2.  __X__3.  On the morning of surgery brush your teeth with toothpaste and water, you                may rinse your mouth with mouthwash if you wish.  Do not swallow any toothpaste of mouthwash.     _X__ 4.  No Alcohol for 24 hours before or after surgery.   _X__ 5.  Do Not Smoke or use e-cigarettes For 24 Hours Prior to Your Surgery.                 Do not use any chewable tobacco products for at least 6 hours prior to                 Surgery.  _X__  6.  Do not use any recreational drugs (marijuana, cocaine, heroin, ecstasy, MDMA or other)                For at least one week prior to your surgery.  Combination of these drugs with  anesthesia                May have life threatening results.  __X__  7.  Notify your doctor if there is any change in your medical condition      (cold, fever, infections).     Do not wear jewelry, make-up, hairpins, clips or nail polish. Do not wear lotions, powders, or perfumes. You may wear deodorant. Do not shave 48 hours prior to surgery. Men may shave face and neck. Do not bring valuables to the hospital.    [facility] is not responsible for any belongings or valuables.  Contacts, dentures or bridgework may not be worn into surgery. Leave your suitcase in the car. After  surgery it may be brought to your room. For patients admitted to the hospital, discharge time is determined by your treatment team.   Patients discharged the day of surgery will not be allowed to drive home.   Make arrangements for someone to be with you for the first 24 hours of your Same Day Discharge.    Please read over the following fact sheets that you were given:   Total Joint Packet    __X__ Take these medicines the morning of surgery with A SIP OF WATER:    1. None   2.   3.   4.  5.  6.  ____ Fleet Enema (as directed)   __X__ Use CHG Soap (or wipes) as directed  ____ Use Benzoyl Peroxide Gel as instructed  ____ Use inhalers on the day of surgery  ____ Stop metformin 2 days prior to surgery    ____ Take 1/2 of usual insulin dose the night before surgery. No insulin the morning          of surgery.   ____ Call your PCP, cardiologist, or Pulmonologist if taking Coumadin/Plavix/aspirin and ask when to stop before your surgery.   __X__ One Week prior to surgery- Stop Anti-inflammatories such as Ibuprofen, Aleve, Advil, Motrin, meloxicam (MOBIC), diclofenac, etodolac, ketorolac, Toradol, Daypro, piroxicam, Goody's or BC powders. OK TO USE TYLENOL IF NEEDED   __X__ Stop supplements until after surgery.    ____ Bring C-Pap to the hospital.    If you have any questions regarding your  pre-procedure instructions,  Please call Pre-admit Testing at [phone].

## 2021-04-11 ENCOUNTER — Other Ambulatory Visit: Payer: Self-pay

## 2021-04-11 ENCOUNTER — Other Ambulatory Visit
Admission: RE | Admit: 2021-04-11 | Discharge: 2021-04-11 | Disposition: A | Discharge status: Home / Self Care | Payer: Medicare Other | Source: Ambulatory Visit | Attending: Orthopedic Surgery | Admitting: Orthopedic Surgery

## 2021-04-11 DIAGNOSIS — Z20822 Contact with and (suspected) exposure to covid-19: Secondary | ICD-10-CM | POA: Diagnosis not present

## 2021-04-11 DIAGNOSIS — Z01812 Encounter for preprocedural laboratory examination: Secondary | ICD-10-CM | POA: Diagnosis present

## 2021-04-12 LAB — SARS CORONAVIRUS 2 (TAT 6-24 HRS): SARS Coronavirus 2: NEGATIVE

## 2021-04-12 MED ORDER — ORAL CARE MOUTH RINSE: 15.0000 mL | Freq: Once | OROMUCOSAL | Status: AC

## 2021-04-12 MED ORDER — CEFAZOLIN SODIUM-DEXTROSE 2-4 GM/100ML-% IV SOLN
2.0000 g | INTRAVENOUS | Status: AC
  Administered 2021-04-13: 2 g via INTRAVENOUS

## 2021-04-12 MED ORDER — LACTATED RINGERS IV SOLN: INTRAVENOUS | Status: DC

## 2021-04-12 MED ORDER — FAMOTIDINE 20 MG PO TABS
20.0000 mg | ORAL_TABLET | Freq: Once | ORAL | Status: DC
  Filled 2021-04-12: qty 1

## 2021-04-12 MED ORDER — CHLORHEXIDINE GLUCONATE 0.12 % MT SOLN
15.0000 mL | Freq: Once | OROMUCOSAL | Status: AC
Start: 2021-04-12 — End: 2021-04-13

## 2021-04-13 ENCOUNTER — Ambulatory Visit: Payer: Medicare Other | Admitting: Urgent Care

## 2021-04-13 ENCOUNTER — Ambulatory Visit: Payer: Medicare Other | Admitting: Registered Nurse

## 2021-04-13 ENCOUNTER — Ambulatory Visit: Payer: Medicare Other

## 2021-04-13 ENCOUNTER — Encounter
Admission: RE | Disposition: A | Discharge status: Home / Self Care | Payer: Self-pay | Source: Home / Self Care | Attending: Orthopedic Surgery

## 2021-04-13 ENCOUNTER — Observation Stay
Admission: RE | Admit: 2021-04-13 | Discharge: 2021-04-14 | Disposition: A | Discharge status: Home / Self Care | Payer: Medicare Other | Attending: Orthopedic Surgery | Admitting: Orthopedic Surgery

## 2021-04-13 ENCOUNTER — Observation Stay: Payer: Medicare Other

## 2021-04-13 ENCOUNTER — Encounter: Payer: Self-pay | Admitting: Orthopedic Surgery

## 2021-04-13 ENCOUNTER — Other Ambulatory Visit: Payer: Self-pay

## 2021-04-13 DIAGNOSIS — N189 Chronic kidney disease, unspecified: Secondary | ICD-10-CM | POA: Insufficient documentation

## 2021-04-13 DIAGNOSIS — M1612 Unilateral primary osteoarthritis, left hip: Principal | ICD-10-CM | POA: Insufficient documentation

## 2021-04-13 DIAGNOSIS — Z96642 Presence of left artificial hip joint: Secondary | ICD-10-CM

## 2021-04-13 DIAGNOSIS — Z79899 Other long term (current) drug therapy: Secondary | ICD-10-CM | POA: Diagnosis not present

## 2021-04-13 DIAGNOSIS — Z419 Encounter for procedure for purposes other than remedying health state, unspecified: Secondary | ICD-10-CM

## 2021-04-13 DIAGNOSIS — I129 Hypertensive chronic kidney disease with stage 1 through stage 4 chronic kidney disease, or unspecified chronic kidney disease: Secondary | ICD-10-CM | POA: Insufficient documentation

## 2021-04-13 DIAGNOSIS — G8918 Other acute postprocedural pain: Secondary | ICD-10-CM

## 2021-04-13 HISTORY — PX: TOTAL HIP ARTHROPLASTY: SHX124

## 2021-04-13 LAB — CBC
HCT: 41.6 % (ref 39.0–52.0)
Hemoglobin: 15 g/dL (ref 13.0–17.0)
MCH: 33.8 pg (ref 26.0–34.0)
MCHC: 36.1 g/dL — ABNORMAL HIGH (ref 30.0–36.0)
MCV: 93.7 fL (ref 80.0–100.0)
Platelets: 138 10*3/uL — ABNORMAL LOW (ref 150–400)
RBC: 4.44 MIL/uL (ref 4.22–5.81)
RDW: 13.4 % (ref 11.5–15.5)
WBC: 7.1 10*3/uL (ref 4.0–10.5)
nRBC: 0 % (ref 0.0–0.2)

## 2021-04-13 LAB — CREATININE, SERUM
Creatinine, Ser: 0.92 mg/dL (ref 0.61–1.24)
GFR, Estimated: >60 mL/min (ref >60)

## 2021-04-13 LAB — ABO/RH: ABO/RH(D): O NEG

## 2021-04-13 SURGERY — ARTHROPLASTY, HIP, TOTAL, ANTERIOR APPROACH
Anesthesia: Spinal | Site: Hip | Laterality: Left

## 2021-04-13 MED ORDER — ZOLPIDEM TARTRATE 5 MG PO TABS
5.0000 mg | ORAL_TABLET | Freq: Every evening | ORAL | Status: DC | PRN
  Administered 2021-04-14: 5 mg via ORAL
  Filled 2021-04-13: qty 1

## 2021-04-13 MED ORDER — PROPOFOL 10 MG/ML IV BOLUS
INTRAVENOUS | Status: DC | PRN
  Administered 2021-04-13: 40 mg via INTRAVENOUS

## 2021-04-13 MED ORDER — ACETAMINOPHEN 325 MG PO TABS: 325.0000 mg | ORAL_TABLET | Freq: Four times a day (QID) | ORAL | Status: DC | PRN

## 2021-04-13 MED ORDER — EPHEDRINE 5 MG/ML INJ
INTRAVENOUS | Status: AC
  Filled 2021-04-13: qty 5

## 2021-04-13 MED ORDER — CEFAZOLIN SODIUM-DEXTROSE 2-4 GM/100ML-% IV SOLN
INTRAVENOUS | Status: AC
  Filled 2021-04-13: qty 100

## 2021-04-13 MED ORDER — MENTHOL 3 MG MT LOZG
1.0000 | LOZENGE | OROMUCOSAL | Status: DC | PRN
  Filled 2021-04-13: qty 9

## 2021-04-13 MED ORDER — FLEET ENEMA 7-19 GM/118ML RE ENEM: 1.0000 | ENEMA | Freq: Once | RECTAL | Status: DC | PRN

## 2021-04-13 MED ORDER — MORPHINE SULFATE (PF) 2 MG/ML IV SOLN
0.5000 mg | INTRAVENOUS | Status: DC | PRN
  Administered 2021-04-13: 1 mg via INTRAVENOUS
  Filled 2021-04-13: qty 1

## 2021-04-13 MED ORDER — FENTANYL CITRATE (PF) 100 MCG/2ML IJ SOLN: 25.0000 ug | INTRAMUSCULAR | Status: DC | PRN

## 2021-04-13 MED ORDER — ACETAMINOPHEN 10 MG/ML IV SOLN
INTRAVENOUS | Status: DC | PRN
  Administered 2021-04-13: 1000 mg via INTRAVENOUS

## 2021-04-13 MED ORDER — PHENYLEPHRINE HCL (PRESSORS) 10 MG/ML IV SOLN
INTRAVENOUS | Status: DC | PRN
Start: 2021-04-13 — End: 2021-04-13
  Administered 2021-04-13 (×3): 200 ug via INTRAVENOUS
  Administered 2021-04-13: 100 ug via INTRAVENOUS

## 2021-04-13 MED ORDER — SODIUM CHLORIDE 0.9 % IV SOLN: INTRAVENOUS | Status: DC

## 2021-04-13 MED ORDER — SODIUM CHLORIDE 0.9 % IV SOLN
INTRAVENOUS | Status: DC | PRN
  Administered 2021-04-13: 20 ug/min via INTRAVENOUS

## 2021-04-13 MED ORDER — MAGNESIUM HYDROXIDE 400 MG/5ML PO SUSP
30.0000 mL | Freq: Every day | ORAL | Status: DC
  Administered 2021-04-13: 30 mL via ORAL
  Filled 2021-04-13: qty 30

## 2021-04-13 MED ORDER — FENOFIBRATE 54 MG PO TABS
54.0000 mg | ORAL_TABLET | Freq: Every day | ORAL | Status: DC
  Administered 2021-04-14: 54 mg via ORAL
  Filled 2021-04-13 (×2): qty 1

## 2021-04-13 MED ORDER — HYDROCODONE-ACETAMINOPHEN 5-325 MG PO TABS
1.0000 | ORAL_TABLET | ORAL | Status: DC | PRN
  Administered 2021-04-13 – 2021-04-14 (×3): 1 via ORAL
  Filled 2021-04-13 (×3): qty 1

## 2021-04-13 MED ORDER — MIDAZOLAM HCL 2 MG/2ML IJ SOLN
INTRAMUSCULAR | Status: AC
  Filled 2021-04-13: qty 2

## 2021-04-13 MED ORDER — VITAMIN B-12 1000 MCG PO TABS
1000.0000 ug | ORAL_TABLET | Freq: Every day | ORAL | Status: DC
  Administered 2021-04-14: 1000 ug via ORAL
  Filled 2021-04-13: qty 1

## 2021-04-13 MED ORDER — CHLORHEXIDINE GLUCONATE 0.12 % MT SOLN
OROMUCOSAL | Status: AC
  Administered 2021-04-13: 15 mL via OROMUCOSAL
  Filled 2021-04-13: qty 15

## 2021-04-13 MED ORDER — TRAMADOL HCL 50 MG PO TABS
50.0000 mg | ORAL_TABLET | Freq: Four times a day (QID) | ORAL | Status: DC
  Administered 2021-04-13 – 2021-04-14 (×4): 50 mg via ORAL
  Filled 2021-04-13 (×4): qty 1

## 2021-04-13 MED ORDER — PHENOL 1.4 % MT LIQD
1.0000 | OROMUCOSAL | Status: DC | PRN
  Filled 2021-04-13: qty 177

## 2021-04-13 MED ORDER — SODIUM CHLORIDE 0.9 % IV SOLN
INTRAVENOUS | Status: DC | PRN
  Administered 2021-04-13: 60 mL

## 2021-04-13 MED ORDER — VITAMIN D 25 MCG (1000 UNIT) PO TABS
1000.0000 [IU] | ORAL_TABLET | Freq: Every day | ORAL | Status: DC
  Administered 2021-04-14: 1000 [IU] via ORAL
  Filled 2021-04-13: qty 1

## 2021-04-13 MED ORDER — DOCUSATE SODIUM 100 MG PO CAPS
100.0000 mg | ORAL_CAPSULE | Freq: Two times a day (BID) | ORAL | Status: DC
  Administered 2021-04-13 – 2021-04-14 (×2): 100 mg via ORAL
  Filled 2021-04-13 (×2): qty 1

## 2021-04-13 MED ORDER — ALUM & MAG HYDROXIDE-SIMETH 200-200-20 MG/5ML PO SUSP: 30.0000 mL | ORAL | Status: DC | PRN

## 2021-04-13 MED ORDER — HYDROCODONE-ACETAMINOPHEN 7.5-325 MG PO TABS: 1.0000 | ORAL_TABLET | ORAL | Status: DC | PRN

## 2021-04-13 MED ORDER — PROPOFOL 500 MG/50ML IV EMUL
INTRAVENOUS | Status: DC | PRN
  Administered 2021-04-13: 2.6 ug/kg/min via INTRAVENOUS
  Administered 2021-04-13: 100 ug/kg/min via INTRAVENOUS

## 2021-04-13 MED ORDER — BUPIVACAINE HCL (PF) 0.5 % IJ SOLN
INTRAMUSCULAR | Status: DC | PRN
  Administered 2021-04-13: 2.6 mL

## 2021-04-13 MED ORDER — EPHEDRINE SULFATE 50 MG/ML IJ SOLN
INTRAMUSCULAR | Status: DC | PRN
  Administered 2021-04-13: 15 mg via INTRAVENOUS

## 2021-04-13 MED ORDER — BUPIVACAINE-EPINEPHRINE 0.25% -1:200000 IJ SOLN
INTRAMUSCULAR | Status: DC | PRN
  Administered 2021-04-13: 30 mL

## 2021-04-13 MED ORDER — ONDANSETRON HCL 4 MG/2ML IJ SOLN: 4.0000 mg | Freq: Once | INTRAMUSCULAR | Status: DC | PRN

## 2021-04-13 MED ORDER — KETAMINE HCL 50 MG/5ML IJ SOSY
PREFILLED_SYRINGE | INTRAMUSCULAR | Status: AC
  Filled 2021-04-13: qty 5

## 2021-04-13 MED ORDER — ONDANSETRON HCL 4 MG/2ML IJ SOLN: 4.0000 mg | Freq: Four times a day (QID) | INTRAMUSCULAR | Status: DC | PRN

## 2021-04-13 MED ORDER — METHOCARBAMOL 1000 MG/10ML IJ SOLN
500.0000 mg | Freq: Four times a day (QID) | INTRAVENOUS | Status: DC | PRN
  Filled 2021-04-13: qty 5

## 2021-04-13 MED ORDER — KETAMINE HCL 10 MG/ML IJ SOLN
INTRAMUSCULAR | Status: DC | PRN
  Administered 2021-04-13: 20 mg via INTRAVENOUS
  Administered 2021-04-13: 10 mg via INTRAVENOUS

## 2021-04-13 MED ORDER — METHOCARBAMOL 500 MG PO TABS
500.0000 mg | ORAL_TABLET | Freq: Four times a day (QID) | ORAL | Status: DC | PRN
  Administered 2021-04-13: 500 mg via ORAL
  Filled 2021-04-13: qty 1

## 2021-04-13 MED ORDER — ONDANSETRON HCL 4 MG PO TABS: 4.0000 mg | ORAL_TABLET | Freq: Four times a day (QID) | ORAL | Status: DC | PRN

## 2021-04-13 MED ORDER — HYDROCHLOROTHIAZIDE 25 MG PO TABS
25.0000 mg | ORAL_TABLET | Freq: Every day | ORAL | Status: DC
  Administered 2021-04-14: 25 mg via ORAL
  Filled 2021-04-13: qty 1

## 2021-04-13 MED ORDER — BISACODYL 10 MG RE SUPP
10.0000 mg | Freq: Every day | RECTAL | Status: DC | PRN
  Filled 2021-04-13: qty 1

## 2021-04-13 MED ORDER — ENOXAPARIN SODIUM 40 MG/0.4ML IJ SOSY
40.0000 mg | PREFILLED_SYRINGE | INTRAMUSCULAR | Status: DC
  Administered 2021-04-14: 40 mg via SUBCUTANEOUS
  Filled 2021-04-13: qty 0.4

## 2021-04-13 MED ORDER — MIDAZOLAM HCL 5 MG/5ML IJ SOLN
INTRAMUSCULAR | Status: DC | PRN
  Administered 2021-04-13: 2 mg via INTRAVENOUS

## 2021-04-13 MED ORDER — LOSARTAN POTASSIUM 50 MG PO TABS
50.0000 mg | ORAL_TABLET | ORAL | Status: DC
  Administered 2021-04-14: 50 mg via ORAL
  Filled 2021-04-13: qty 1

## 2021-04-13 MED ORDER — CEFAZOLIN SODIUM-DEXTROSE 2-4 GM/100ML-% IV SOLN
2.0000 g | Freq: Four times a day (QID) | INTRAVENOUS | Status: AC
Start: 2021-04-13 — End: 2021-04-13
  Administered 2021-04-13 (×2): 2 g via INTRAVENOUS
  Filled 2021-04-13 (×2): qty 100

## 2021-04-13 MED ORDER — NEOMYCIN-POLYMYXIN B GU 40-200000 IR SOLN
Status: DC | PRN
  Administered 2021-04-13: 4 mL

## 2021-04-13 MED ORDER — POLYETHYLENE GLYCOL 3350 17 G PO PACK
17.0000 g | PACK | Freq: Every day | ORAL | Status: DC | PRN
  Administered 2021-04-14: 17 g via ORAL
  Filled 2021-04-13 (×2): qty 1

## 2021-04-13 MED ORDER — 0.9 % SODIUM CHLORIDE (POUR BTL) OPTIME
TOPICAL | Status: DC | PRN
  Administered 2021-04-13: 1000 mL

## 2021-04-13 MED ORDER — DIPHENHYDRAMINE HCL 12.5 MG/5ML PO ELIX
12.5000 mg | ORAL_SOLUTION | ORAL | Status: DC | PRN
  Filled 2021-04-13: qty 10

## 2021-04-13 SURGICAL SUPPLY — 62 items
BLADE SAGITTAL AGGR TOOTH XLG (BLADE) ×2 IMPLANT
BNDG COHESIVE 6X5 TAN ST LF (GAUZE/BANDAGES/DRESSINGS) ×6 IMPLANT
CANISTER WOUND CARE 500ML ATS (WOUND CARE) ×2 IMPLANT
CHLORAPREP W/TINT 26 (MISCELLANEOUS) ×2 IMPLANT
COVER BACK TABLE REUSABLE LG (DRAPES) ×2 IMPLANT
DRAPE 3/4 80X56 (DRAPES) ×6 IMPLANT
DRAPE C-ARM XRAY 36X54 (DRAPES) ×2 IMPLANT
DRAPE INCISE IOBAN 66X60 STRL (DRAPES) IMPLANT
DRAPE POUCH INSTRU U-SHP 10X18 (DRAPES) ×2 IMPLANT
DRESSING SURGICEL FIBRLLR 1X2 (HEMOSTASIS) ×2 IMPLANT
DRSG MEPILEX SACRM 8.7X9.8 (GAUZE/BANDAGES/DRESSINGS) ×2 IMPLANT
DRSG OPSITE POSTOP 4X8 (GAUZE/BANDAGES/DRESSINGS) ×4 IMPLANT
DRSG SURGICEL FIBRILLAR 1X2 (HEMOSTASIS) ×4
ELECT BLADE 6.5 EXT (BLADE) ×2 IMPLANT
ELECT REM PT RETURN 9FT ADLT (ELECTROSURGICAL) ×2
ELECTRODE REM PT RTRN 9FT ADLT (ELECTROSURGICAL) ×1 IMPLANT
GAUZE 4X4 16PLY ~~LOC~~+RFID DBL (SPONGE) ×2 IMPLANT
GLOVE SURG SYN 9.0  PF PI (GLOVE) ×2
GLOVE SURG SYN 9.0 PF PI (GLOVE) ×2 IMPLANT
GLOVE SURG UNDER POLY LF SZ9 (GLOVE) ×2 IMPLANT
GOWN SRG 2XL LVL 4 RGLN SLV (GOWNS) ×1 IMPLANT
GOWN STRL NON-REIN 2XL LVL4 (GOWNS) ×1
GOWN STRL REUS W/ TWL LRG LVL3 (GOWN DISPOSABLE) ×1 IMPLANT
GOWN STRL REUS W/TWL LRG LVL3 (GOWN DISPOSABLE) ×1
HEMOVAC 400CC 10FR (MISCELLANEOUS) IMPLANT
HIP FEM HD L 28 (Head) ×2 IMPLANT
HOLDER FOLEY CATH W/STRAP (MISCELLANEOUS) ×2 IMPLANT
IRRIGATION SURGIPHOR STRL (IV SOLUTION) IMPLANT
KIT PREVENA INCISION MGT 13 (CANNISTER) ×2 IMPLANT
LINER DM 28MM (Liner) ×2 IMPLANT
LINER DM SZH 28X56 (Liner) ×1 IMPLANT
MANIFOLD NEPTUNE II (INSTRUMENTS) ×2 IMPLANT
MAT ABSORB  FLUID 56X50 GRAY (MISCELLANEOUS) ×1
MAT ABSORB FLUID 56X50 GRAY (MISCELLANEOUS) ×1 IMPLANT
NDL SAFETY ECLIPSE 18X1.5 (NEEDLE) ×1 IMPLANT
NEEDLE HYPO 18GX1.5 SHARP (NEEDLE) ×1
NEEDLE SPNL 20GX3.5 QUINCKE YW (NEEDLE) ×4 IMPLANT
NS IRRIG 1000ML POUR BTL (IV SOLUTION) ×2 IMPLANT
PACK HIP COMPR (MISCELLANEOUS) ×2 IMPLANT
SCALPEL PROTECTED #10 DISP (BLADE) ×4 IMPLANT
SHELL ACETABULAR DM  56MM (Shell) ×2 IMPLANT
SOL PREP PVP 2OZ (MISCELLANEOUS) ×2
SOLUTION PREP PVP 2OZ (MISCELLANEOUS) ×1 IMPLANT
SPONGE DRAIN TRACH 4X4 STRL 2S (GAUZE/BANDAGES/DRESSINGS) IMPLANT
SPONGE T-LAP 18X18 ~~LOC~~+RFID (SPONGE) ×4 IMPLANT
STAPLER SKIN PROX 35W (STAPLE) ×2 IMPLANT
STEM FEM STD CEMENTLESS SZ6 (Stem) ×2 IMPLANT
STRAP SAFETY 5IN WIDE (MISCELLANEOUS) ×2 IMPLANT
SUT DVC 2 QUILL PDO  T11 36X36 (SUTURE) ×1
SUT DVC 2 QUILL PDO T11 36X36 (SUTURE) ×1 IMPLANT
SUT SILK 0 (SUTURE) ×1
SUT SILK 0 30XBRD TIE 6 (SUTURE) ×1 IMPLANT
SUT V-LOC 90 ABS DVC 3-0 CL (SUTURE) ×2 IMPLANT
SUT VIC AB 1 CT1 36 (SUTURE) ×2 IMPLANT
SYR 20ML LL LF (SYRINGE) ×2 IMPLANT
SYR 30ML LL (SYRINGE) ×2 IMPLANT
SYR 50ML LL SCALE MARK (SYRINGE) ×4 IMPLANT
SYR BULB IRRIG 60ML STRL (SYRINGE) ×2 IMPLANT
TAPE MICROFOAM 4IN (TAPE) ×2 IMPLANT
TOWEL OR 17X26 4PK STRL BLUE (TOWEL DISPOSABLE) ×2 IMPLANT
TRAY FOLEY MTR SLVR 16FR STAT (SET/KITS/TRAYS/PACK) ×2 IMPLANT
WATER STERILE IRR 500ML POUR (IV SOLUTION) ×2 IMPLANT

## 2021-04-13 NOTE — Op Note (Signed)
04/13/2021  11:33 AM  PATIENT:  Erik Moore  65 y.o. male  PRE-OPERATIVE DIAGNOSIS:  Primary osteoarthritis of left hip M16.12  POST-OPERATIVE DIAGNOSIS:  Primary osteoarthritis of left hip M16.12  PROCEDURE:  Procedure(s): TOTAL HIP ARTHROPLASTY ANTERIOR APPROACH (Left)  SURGEON: Laurene Footman, MD  ASSISTANTS: None  ANESTHESIA:   spinal  EBL:  Total I/O In: 1000 [I.V.:800; IV Piggyback:200] Out: -   BLOOD ADMINISTERED:none  DRAINS:  Incisional wound    LOCAL MEDICATIONS USED:  MARCAINE    and OTHER Exparel  SPECIMEN: Left femoral head  DISPOSITION OF SPECIMEN:  PATHOLOGY  COUNTS:  YES  TOURNIQUET:  * No tourniquets in log *  IMPLANTS: Medacta SMS 6 standard stem with L metal 28 mm head, 56 mm Mpact DM cup and liner  DICTATION: .Dragon Dictation   The patient was brought to the operating room and after spinal anesthesia was obtained patient was placed on the operative table with the ipsilateral foot into the Medacta attachment, contralateral leg on a well-padded table. C-arm was brought in and preop template x-ray taken. After prepping and draping in usual sterile fashion appropriate patient identification and timeout procedures were completed. Anterior approach to the hip was obtained and centered over the greater trochanter and TFL muscle. The subcutaneous tissue was incised hemostasis being achieved by electrocautery. TFL fascia was incised and the muscle retracted laterally deep retractor placed. The lateral femoral circumflex vessels were identified and ligated. The anterior capsule was exposed and a capsulotomy performed. The neck was identified and a femoral neck cut carried out with a saw. The head was removed without difficulty and showed sclerotic femoral head and acetabulum. Reaming was carried out to 56 mm and a 56 mm cup trial gave appropriate tightness to the acetabular component a 56 DM cup was impacted into position. The leg was then externally rotated and  ischiofemoral and pubofemoral releases carried out. The femur was sequentially broached to a size 6, size 6 standard with S then L trials were placed and the final components chosen. The 6 standard SMS stem was inserted along with a metal L 28 mm head and 56 mm liner. The hip was reduced and was stable the wound was thoroughly irrigated with fibrillar placed along the posterior capsule and medial neck. The deep fascia ws closed using a heavy Quill after infiltration of 30 cc of quarter percent Sensorcaine with epinephrine diluted with Exparel throughout the case .3-0 V-loc to close the skin with skin staples.  Incisional wound VAC applied and patient was sent to recovery in stable condition.   PLAN OF CARE: Admit for overnight observation

## 2021-04-13 NOTE — Anesthesia Preprocedure Evaluation (Signed)
Anesthesia Evaluation  Patient identified by MRN, date of birth, ID band Patient awake    Reviewed: Allergy & Precautions, H&P , NPO status , Patient's Chart, lab work & pertinent test results, reviewed documented beta blocker date and time   Airway Mallampati: II   Neck ROM: full    Dental  (+) Poor Dentition   Pulmonary neg pulmonary ROS,    Pulmonary exam normal        Cardiovascular Exercise Tolerance: Good hypertension, On Medications negative cardio ROS Normal cardiovascular exam Rhythm:regular Rate:Normal     Neuro/Psych negative neurological ROS  negative psych ROS   GI/Hepatic negative GI ROS, Neg liver ROS,   Endo/Other  negative endocrine ROS  Renal/GU Renal disease  negative genitourinary   Musculoskeletal   Abdominal   Peds  Hematology negative hematology ROS (+)   Anesthesia Other Findings Past Medical History: No date: Arthritis No date: Chronic kidney disease     Comment:  H/O KIDNEY STONES 07/17/2015: Colon polyp No date: History of kidney stones No date: Hyperlipidemia No date: Hypertension No date: Rib fracture Past Surgical History: 2017: COLONOSCOPY     Comment:  Dr Clayborne Dana 1967: TONSILLECTOMY 1/61/0960: UMBILICAL HERNIA REPAIR; N/A     Comment:  Procedure: HERNIA REPAIR UMBILICAL ADULT;  Surgeon:               Robert Bellow, MD;  Location: [facility] ORS;  Service:               General;  Laterality: N/A; BMI    Body Mass Index: 27.94 kg/m     Reproductive/Obstetrics negative OB ROS                             Anesthesia Physical Anesthesia Plan  ASA: 3  Anesthesia Plan: Spinal   Post-op Pain Management:    Induction:   PONV Risk Score and Plan:   Airway Management Planned:   Additional Equipment:   Intra-op Plan:   Post-operative Plan:   Informed Consent: I have reviewed the patients History and Physical, chart, labs and discussed the  procedure including the risks, benefits and alternatives for the proposed anesthesia with the patient or authorized representative who has indicated his/her understanding and acceptance.     Dental Advisory Given  Plan Discussed with: CRNA  Anesthesia Plan Comments:         Anesthesia Quick Evaluation

## 2021-04-13 NOTE — Plan of Care (Signed)

## 2021-04-13 NOTE — Anesthesia Procedure Notes (Addendum)
Spinal  Patient location during procedure: OR Start time: 04/13/2021 9:44 AM End time: 04/13/2021 10:03 AM Reason for block: surgical anesthesia Staffing Performed: resident/CRNA  Resident/CRNA: Lia Foyer, CRNA Preanesthetic Checklist Completed: patient identified, IV checked, site marked, risks and benefits discussed, surgical consent, monitors and equipment checked, pre-op evaluation and timeout performed Spinal Block Patient position: sitting Prep: DuraPrep Patient monitoring: heart rate, cardiac monitor, continuous pulse ox and blood pressure Approach: midline Location: L3-4 Injection technique: single-shot Needle Needle type: Sprotte and Pencan  Needle gauge: 25 G Needle length: 9 cm Assessment Sensory level: T4 Events: CSF return Additional Notes Multiple attempts, patient with scoliosis

## 2021-04-13 NOTE — Evaluation (Signed)
Physical Therapy Evaluation Patient Details Name: Erik Moore MRN: [id number] DOB: 1956/05/27 Today's Date: 04/13/2021  History of Present Illness  Pt is a 65 yo M diagnosed with primary osteoarthritis of the left hip and is s/p elective L THA. PMH includes: HTN, CAD, and arthritis.   Clinical Impression  Pt was pleasant and motivated to participate during the session and performed well with functional tasks.  Pt required no assistance during the session, only cuing and extra time/effort with mobility.  Pt was able to take steady steps near the EOB and from bed to chair with step-to pattern and no LOB or buckling.  Pt reported no adverse symptoms during the session other than mild L hip pain.  Pt is expected to make good progress while in acute care and will benefit from HHPT upon discharge to safely address deficits listed in patient problem list for decreased caregiver assistance and eventual return to PLOF.         Recommendations for follow up therapy are one component of a multi-disciplinary discharge planning process, led by the attending physician.  Recommendations may be updated based on patient status, additional functional criteria and insurance authorization.  Follow Up Recommendations Home health PT;Supervision for mobility/OOB    Equipment Recommendations  Rolling walker with 5" wheels;Other (comment) (Pt has an elevated toilet seat, OT to assess need for 3-in-1)    Recommendations for Other Services       Precautions / Restrictions Precautions Precautions: Anterior Hip;Fall Precaution Booklet Issued: Yes (comment) Restrictions Weight Bearing Restrictions: Yes LLE Weight Bearing: Weight bearing as tolerated      Mobility  Bed Mobility Overal bed mobility: Modified Independent             General bed mobility comments: Extra time and effort required but no physical assistance needed    Transfers Overall transfer level: Needs assistance Equipment used:  Rolling walker (2 wheeled) Transfers: Sit to/from Stand Sit to Stand: Min guard         General transfer comment: Min verbal and visual cues for sequencing with good control and stability  Ambulation/Gait Ambulation/Gait assistance: Min guard Gait Distance (Feet): 4 Feet Assistive device: Rolling walker (2 wheeled) Gait Pattern/deviations: Step-to pattern;Antalgic;Decreased stance time - left Gait velocity: decreased   General Gait Details: Mildly antalgic gait pattern on the LLE but steady without LOB or buckling  Stairs            Wheelchair Mobility    Modified Rankin (Stroke Patients Only)       Balance Overall balance assessment: Needs assistance   Sitting balance-Leahy Scale: Normal     Standing balance support: Bilateral upper extremity supported;During functional activity Standing balance-Leahy Scale: Good                               Pertinent Vitals/Pain Pain Assessment: 0-10 Pain Score: 3  Pain Location: L hip Pain Descriptors / Indicators: Sore Pain Intervention(s): Repositioned;Premedicated before session;Monitored during session;Ice applied    Home Living Family/patient expects to be discharged to:: Private residence Living Arrangements: Spouse/significant other Available Help at Discharge: Family;Available 24 hours/day Type of Home: House Home Access: Level entry     Home Layout: Two level;Able to live on main level with bedroom/bathroom Home Equipment: None      Prior Function Level of Independence: Independent         Comments: Ind amb community distances without an AD, Ind with ADLs, no  fall history     Hand Dominance        Extremity/Trunk Assessment   Upper Extremity Assessment Upper Extremity Assessment: Overall WFL for tasks assessed    Lower Extremity Assessment Lower Extremity Assessment: Generalized weakness;LLE deficits/detail LLE Deficits / Details: BLE ankle strength, AROM, and sensation to  light touch grossly intact LLE: Unable to fully assess due to pain LLE Sensation: WNL       Communication   Communication: No difficulties  Cognition Arousal/Alertness: Awake/alert Behavior During Therapy: WFL for tasks assessed/performed Overall Cognitive Status: Within Functional Limits for tasks assessed                                        General Comments      Exercises Total Joint Exercises Ankle Circles/Pumps: AROM;Strengthening;Both;10 reps Quad Sets: Strengthening;Both;10 reps Hip ABduction/ADduction: AROM;Both;5 reps Long Arc Quad: AROM;Strengthening;Both;10 reps Knee Flexion: AROM;Strengthening;Both;10 reps Marching in Standing: AROM;Strengthening;Both;5 reps;Standing Other Exercises Other Exercises: HEP education per handout Other Exercises: Anterior hip precaution education per handout (avoiding fencer position including while exiting bed to the L)   Assessment/Plan    PT Assessment Patient needs continued PT services  PT Problem List Decreased strength;Decreased activity tolerance;Decreased balance;Decreased mobility;Decreased knowledge of use of DME;Pain       PT Treatment Interventions DME instruction;Gait training;Stair training;Functional mobility training;Therapeutic activities;Therapeutic exercise;Balance training;Patient/family education    PT Goals (Current goals can be found in the Care Plan section)  Acute Rehab PT Goals Patient Stated Goal: To walk better without pain PT Goal Formulation: With patient Time For Goal Achievement: 04/26/21 Potential to Achieve Goals: Good    Frequency BID   Barriers to discharge        Co-evaluation               AM-PAC PT "6 Clicks" Mobility  Outcome Measure Help needed turning from your back to your side while in a flat bed without using bedrails?: A Little Help needed moving from lying on your back to sitting on the side of a flat bed without using bedrails?: A Little Help  needed moving to and from a bed to a chair (including a wheelchair)?: A Little Help needed standing up from a chair using your arms (e.g., wheelchair or bedside chair)?: A Little Help needed to walk in hospital room?: A Little Help needed climbing 3-5 steps with a railing? : A Lot 6 Click Score: 17    End of Session Equipment Utilized During Treatment: Gait belt Activity Tolerance: Patient tolerated treatment well Patient left: in chair;with call bell/phone within reach;with chair alarm set;with family/visitor present Nurse Communication: Mobility status;Weight bearing status;Other (comment) (No SCD's in room) PT Visit Diagnosis: Other abnormalities of gait and mobility (R26.89);Muscle weakness (generalized) (M62.81);Pain Pain - Right/Left: Left Pain - part of body: Hip    Time: 2505-3976 PT Time Calculation (min) (ACUTE ONLY): 33 min   Charges:   PT Evaluation $PT Eval Moderate Complexity: 1 Mod PT Treatments $Therapeutic Exercise: 8-22 mins        D. Royetta Asal PT, DPT 04/13/21, 4:34 PM

## 2021-04-13 NOTE — Progress Notes (Signed)
Pt's spinal anesthetic regressing T12 -L-1, Dr Andree Elk notified and Dr Andree Elk stated that pt can be transferred to room 134.

## 2021-04-13 NOTE — TOC Progression Note (Signed)
Transition of Care [facility]) - Progression Note    Patient Details  Name: Erik Moore MRN: [id number] Date of Birth: 1956/06/19  Transition of Care [facility]) CM/SW [place], RN Phone Number: 04/13/2021, 4:00 PM  Clinical Narrative:   Patient lives at home with spouse, who can assist on discharge.  Patient and spouse have no concerns about transportation to appointments or pharmacy, and patient is able to take medications as directed.  [facility] will be providing services to patient at home and have already contacted patient and wife.    Patient accepts Rolling Walker and 3 n 1 to be delivered to patient's room by [employer] tomorrow.  TOC contact information given, TOC will follow for needs    Expected Discharge Plan: [place] Barriers to Discharge: Continued Medical Work up  Expected Discharge Plan and Services Expected Discharge Plan: [place]   Discharge Planning Services: CM Consult Post Acute Care Choice: Home Health, Durable Medical Equipment Living arrangements for the past 2 months: Single Family Home                           HH Arranged: PT, OT [facility] Agency: [place] Date [place]: 04/13/21   Representative spoke with at [place]: [place] confirmed   Social Determinants of Health ([place]) Interventions    Readmission Risk Interventions No flowsheet data found.

## 2021-04-13 NOTE — Transfer of Care (Signed)
Immediate Anesthesia Transfer of Care Note  Patient: Erik Moore  Procedure(s) Performed: TOTAL HIP ARTHROPLASTY ANTERIOR APPROACH (Left: Hip)  Patient Location: PACU  Anesthesia Type:General and Spinal  Level of Consciousness: drowsy  Airway & Oxygen Therapy: Patient Spontanous Breathing and Patient connected to face mask oxygen  Post-op Assessment: Report given to RN and Post -op Vital signs reviewed and stable  Post vital signs: Reviewed and stable  Last Vitals:  Vitals Value Taken Time  BP 80/61 04/13/21 1130  Temp    Pulse 58 04/13/21 1133  Resp 15 04/13/21 1133  SpO2 96 % 04/13/21 1133  Vitals shown include unvalidated device data.  Last Pain:  Vitals:   04/13/21 0821  TempSrc: Temporal  PainSc: 0-No pain      Patients Stated Pain Goal: 0 (19/47/12 5271)  Complications: No notable events documented.

## 2021-04-13 NOTE — H&P (Signed)
Chief Complaint  Patient presents with   Pre-op Exam  Left THA scheduled 04/13/21 by Dr. Rudene Christians    History of the Present Illness: Erik Moore is a 65 y.o. male here today for history and physical for left total hip arthroplasty with Dr. Hessie Knows on 04/13/2021. Patient had x-rays back in July 2022 showing severe osteoarthritis with collapse of the femoral head, complete loss of superior joint space with extensive cyst in the femoral head and medial and inferior osteophytes of the acetabulum. Patient's pain is been severe and debilitating and interfering with quality of life and activities a living. Over the last year his pain has been increasing, pain has been severe over the last few weeks. He has pain in his groin, thigh, buttocks.  The patient has hypertension. He has never had cancer. He is not diabetic. He does not smoke. He drinks alcohol. He weighs 207 pounds, and is 6 feet tall. He takes hypertensive medication.  The patient is employed in [employer].   I have reviewed past medical, surgical, social and family history, and allergies as documented in the EMR.  Past Medical History: Past Medical History:  Diagnosis Date   Chronic kidney disease  H/O KIDNEY STONES   Colon polyp 2017   Gout, joint   Hyperlipidemia   Hypertension   Rib fracture   Umbilical hernia without obstruction and without gangrene 09/16/2015   Past Surgical History: Past Surgical History:  Procedure Laterality Date   HERNIA REPAIR   Rt arthroscopic rotator cuff repair(subscapularis)mini-open rotator cuff repair supraspinatus) open biceps tenodesis, extensive debridement of shoulder (glenohumeral and subacromial spaces)subacromial decompression Right 07/12/2020  Dr.Patel   TONSILLECTOMY   Past Family History: Family History  Problem Relation Age of Onset   No Known Problems Mother   No Known Problems Father   Medications: Current Outpatient Medications Ordered in Epic  Medication Sig Dispense  Refill   cholecalciferol (VITAMIN D3) 2,000 unit capsule Take 1 capsule (2,000 Units total) by mouth once daily 360 capsule 11   cyanocobalamin (VITAMIN B12) 1000 MCG tablet Take 2 tablets daily for 2 weeks, then reduce to 1 tablet daily thereafter for Vitamin B12 Deficiency. 360 tablet 11   diclofenac potassium (CATAFLAM) 50 mg tablet Take 1 tablet (50 mg total) by mouth 2 (two) times daily with meals Take 1 tablet by mouth 2 times daily with meals as needed for pain 60 tablet 2   fenofibrate nanocrystallized (TRICOR) 48 MG tablet Take 1 tablet (48 mg total) by mouth once daily 30 tablet 11   hydroCHLOROthiazide (HYDRODIURIL) 25 MG tablet Take 25 mg by mouth every morning   indomethacin (INDOCIN) 50 MG capsule TAKE 1 CAPSULE BY MOUTH THREE TIMES DAILY WITH FOOD AS NEEDED FOR GOUT PAIN   losartan (COZAAR) 50 MG tablet Take 50 mg by mouth once daily.   No current Epic-ordered facility-administered medications on file.   Allergies: No Known Allergies   Body mass index is 28.35 kg/m.  Review of Systems: A comprehensive 14 point ROS was performed, reviewed, and the pertinent orthopaedic findings are documented in the HPI.  Vitals:  04/03/21 0955  BP: 124/70    General Physical Examination:   General:  Well developed, well nourished, no apparent distress, normal affect, antalgic gait without any assistive device.  HEENT: Head normocephalic, atraumatic, PERRL.   Abdomen: Soft, non tender, non distended, Bowel sounds present.  Heart: Examination of the heart reveals regular, rate, and rhythm. There is no murmur noted on ascultation. There is a  normal apical pulse.  Lungs: Lungs are clear to auscultation. There is no wheeze, rhonchi, or crackles. There is normal expansion of bilateral chest walls.   Musculoskeletal Examination:  Examination of the left lower extremity chest patient has severe left hip stiffness. Tenderness over the greater troches region present. Unable to  internally rotate the left hip. Has severe pain with attempted left hip internal rotation. Patient has no swelling warmth erythema or edema throughout the left lower extremity.  Radiographs:  X-rays of the left hip reviewed by me today from 02/02/2021 shows advanced left hip osteoarthritis with deformity of the femoral head and subchondral cyst formation in the femoral head, acetabulum with severe spurring throughout the superior, central and inferior acetabulum. No evidence of acute bony abnormality. Patient also noted to have advanced right hip osteoarthritis  Assessment: ICD-10-CM  1. Primary osteoarthritis of left hip M16.12   Plan: 36. 65 year old male with advanced left hip osteoarthritis. Risks, benefits, complications of a left total hip arthroplasty have been discussed with the patient. Patient has agreed to same procedure with Dr. Hessie Knows on 04/13/2021.   Electronically signed by Feliberto Gottron, [place] at 04/03/2021 10:20 AM EDT  Reviewed  H+P. No changes noted.

## 2021-04-14 ENCOUNTER — Encounter: Payer: Self-pay | Admitting: Orthopedic Surgery

## 2021-04-14 DIAGNOSIS — M1612 Unilateral primary osteoarthritis, left hip: Secondary | ICD-10-CM | POA: Diagnosis not present

## 2021-04-14 LAB — CBC
HCT: 38.3 % — ABNORMAL LOW (ref 39.0–52.0)
Hemoglobin: 13.7 g/dL (ref 13.0–17.0)
MCH: 33.5 pg (ref 26.0–34.0)
MCHC: 35.8 g/dL (ref 30.0–36.0)
MCV: 93.6 fL (ref 80.0–100.0)
Platelets: 125 10*3/uL — ABNORMAL LOW (ref 150–400)
RBC: 4.09 MIL/uL — ABNORMAL LOW (ref 4.22–5.81)
RDW: 13.6 % (ref 11.5–15.5)
WBC: 6.6 10*3/uL (ref 4.0–10.5)
nRBC: 0 % (ref 0.0–0.2)

## 2021-04-14 LAB — BASIC METABOLIC PANEL
Anion gap: 6 (ref 5–15)
BUN: 17 mg/dL (ref 8–23)
CO2: 28 mmol/L (ref 22–32)
Calcium: 8.1 mg/dL — ABNORMAL LOW (ref 8.9–10.3)
Chloride: 99 mmol/L (ref 98–111)
Creatinine, Ser: 0.91 mg/dL (ref 0.61–1.24)
GFR, Estimated: >60 mL/min (ref >60)
Glucose, Bld: 167 mg/dL — ABNORMAL HIGH (ref 70–99)
Potassium: 3.6 mmol/L (ref 3.5–5.1)
Sodium: 133 mmol/L — ABNORMAL LOW (ref 135–145)

## 2021-04-14 LAB — SURGICAL PATHOLOGY

## 2021-04-14 MED ORDER — METHOCARBAMOL 500 MG PO TABS: 500.0000 mg | ORAL_TABLET | Freq: Four times a day (QID) | ORAL | 0 refills | Status: AC | PRN

## 2021-04-14 MED ORDER — DOCUSATE SODIUM 100 MG PO CAPS: 100.0000 mg | ORAL_CAPSULE | Freq: Two times a day (BID) | ORAL | 0 refills | Status: AC

## 2021-04-14 MED ORDER — TRAMADOL HCL 50 MG PO TABS: 50.0000 mg | ORAL_TABLET | Freq: Four times a day (QID) | ORAL | 0 refills | Status: AC | PRN

## 2021-04-14 MED ORDER — POLYETHYLENE GLYCOL 3350 17 G PO PACK: 17.0000 g | PACK | Freq: Every day | ORAL | 0 refills | Status: AC | PRN

## 2021-04-14 MED ORDER — HYDROCODONE-ACETAMINOPHEN 5-325 MG PO TABS: 1.0000 | ORAL_TABLET | ORAL | 0 refills | Status: AC | PRN

## 2021-04-14 MED ORDER — ENOXAPARIN SODIUM 40 MG/0.4ML IJ SOSY: 40.0000 mg | PREFILLED_SYRINGE | INTRAMUSCULAR | 0 refills | Status: AC

## 2021-04-14 NOTE — Progress Notes (Signed)
   Subjective: 1 Day Post-Op Procedure(s) (LRB): TOTAL HIP ARTHROPLASTY ANTERIOR APPROACH (Left) Patient reports pain as mild.   Patient is well, and has had no acute complaints or problems Denies any CP, SOB, ABD pain. We will continue therapy today.  Plan is to go Home after hospital stay.  Objective: Vital signs in last 24 hours: Temp:  [96.8 F (36 C)-98.7 F (37.1 C)] 98 F (36.7 C) (09/30 0759) Pulse Rate:  [60-102] 85 (09/30 0759) Resp:  [12-20] 15 (09/30 0759) BP: (80-147)/(61-94) 126/74 (09/30 0759) SpO2:  [94 %-100 %] 100 % (09/30 0759) Weight:  [93.4 kg] 93.4 kg (09/29 0821)  Intake/Output from previous day: 09/29 0701 - 09/30 0700 In: 1696.3 [P.O.:120; I.V.:1376.3; IV Piggyback:200] Out: 1021 [Urine:1750; Blood:100] Intake/Output this shift: No intake/output data recorded.  Recent Labs    04/13/21 1400 04/14/21 0637  HGB 15.0 13.7   Recent Labs    04/13/21 1400 04/14/21 0637  WBC 7.1 6.6  RBC 4.44 4.09*  HCT 41.6 38.3*  PLT 138* 125*   Recent Labs    04/13/21 1400 04/14/21 0637  NA  --  133*  K  --  3.6  CL  --  99  CO2  --  28  BUN  --  17  CREATININE 0.92 0.91  GLUCOSE  --  167*  CALCIUM  --  8.1*   No results for input(s): LABPT, INR in the last 72 hours.  EXAM General - Patient is Alert, Appropriate, and Oriented Extremity - Neurovascular intact Sensation intact distally Intact pulses distally Dorsiflexion/Plantar flexion intact Dressing - dressing C/D/I and no drainage, provena intact with out drainage Motor Function - intact, moving foot and toes well on exam.   Past Medical History:  Diagnosis Date   Arthritis    Chronic kidney disease    H/O KIDNEY STONES   Colon polyp 07/17/2015   History of kidney stones    Hyperlipidemia    Hypertension    Rib fracture     Assessment/Plan:   1 Day Post-Op Procedure(s) (LRB): TOTAL HIP ARTHROPLASTY ANTERIOR APPROACH (Left) Active Problems:   Status post total hip replacement,  left  Estimated body mass index is 27.94 kg/m as calculated from the following:   Height as of this encounter: 6' (1.829 m).   Weight as of this encounter: 93.4 kg. Advance diet Up with therapy Labs and VSS Pain controlled CM to assist with discharge to Home with HHPT today pending completion of PT goals  DVT Prophylaxis - Lovenox, TED hose, and SCDs Weight-Bearing as tolerated to left leg   T. Rachelle Hora, PA-C [place] 04/14/2021, 8:12 AM

## 2021-04-14 NOTE — Progress Notes (Addendum)
Physical Therapy Treatment Patient Details Name: Erik Moore MRN: [id number] DOB: July 16, 1956 Today's Date: 04/14/2021   History of Present Illness Pt is a 65 yo M diagnosed with primary osteoarthritis of the left hip and is s/p elective L THA. PMH includes: HTN, CAD, and arthritis.    PT Comments    Pt was sitting in recliner with supportive spouse present. He agrees to session and is cooperative and pleasant throughout. He was able to safely perform all mobility, transfers, and gait without safety concerns. Did go up/down one step with RW to simulate home entry. Both pt and supportive spouse feel confident in abilities to safely DC home today. MD made aware. Acute PT recommends DC to home with HHPT to follow.   Recommendations for follow up therapy are one component of a multi-disciplinary discharge planning process, led by the attending physician.  Recommendations may be updated based on patient status, additional functional criteria and insurance authorization.  Follow Up Recommendations  Home health PT;Supervision for mobility/OOB     Equipment Recommendations  Rolling walker with 5" wheels;Other (comment)       Precautions / Restrictions Precautions Precautions: Anterior Hip;Fall Precaution Booklet Issued: Yes (comment) Restrictions Weight Bearing Restrictions: Yes LLE Weight Bearing: Weight bearing as tolerated     Mobility  Bed Mobility Overal bed mobility: Needs Assistance Bed Mobility: Supine to Sit     Supine to sit: Mod assist;HOB elevated     General bed mobility comments: pt was in recliner upon arriving. Per pt, sleeps in recliner at home.    Transfers Overall transfer level: Needs assistance Equipment used: Rolling walker (2 wheeled) Transfers: Sit to/from Stand Sit to Stand: Supervision         General transfer comment: pt was able to STS 3 x during session without physical assistance. Vcs only for improved technique and  sequencing.  Ambulation/Gait Ambulation/Gait assistance: Supervision Gait Distance (Feet): 160 Feet Assistive device: Rolling walker (2 wheeled) Gait Pattern/deviations: Step-to pattern;Antalgic;Decreased stance time - left Gait velocity: decreased   General Gait Details: pt was able to ambulate 160 ft with RW without LOB or unsteadiness. does have slow antalgic gait kinematics that improved throughout gait training.   Stairs Stairs: Yes Stairs assistance: Min guard;Min assist Stair Management: No rails;With walker;Step to pattern Number of Stairs: 1 General stair comments: pt was able to perform 1 step to simulate home entry with CGA to stand and min assist to descend. Spouse and pt feel confident they can perform safely at home. Did issue gait belt for additional security.   Wheelchair Mobility    Modified Rankin (Stroke Patients Only)       Balance Overall balance assessment: Needs assistance Sitting-balance support: Feet supported;No upper extremity supported Sitting balance-Leahy Scale: Normal     Standing balance support: Bilateral upper extremity supported;During functional activity Standing balance-Leahy Scale: Good        Cognition Arousal/Alertness: Awake/alert Behavior During Therapy: WFL for tasks assessed/performed Overall Cognitive Status: Within Functional Limits for tasks assessed        General Comments: Pt is A and O x 4      Exercises Other Exercises Other Exercises: Pt and family educated re: OT role, DME recs, d/c recs, falls prevention, ECS, adapted dressing techniques Other Exercises: Bed mobility, LBD, toilet transfer, handwashing, ambulate to chair, don gown        Pertinent Vitals/Pain Pain Assessment: 0-10 Pain Score: 4  Faces Pain Scale: Hurts little more Pain Location: L hip Pain Descriptors / Indicators:  Sore;Discomfort;Dull Pain Intervention(s): Limited activity within patient's tolerance;Monitored during session;Premedicated  before session;Repositioned;Ice applied    Home Living Family/patient expects to be discharged to:: Private residence Living Arrangements: Spouse/significant other Available Help at Discharge: Family;Available 24 hours/day Type of Home: House Home Access: Level entry   Home Layout: Two level;Able to live on main level with bedroom/bathroom Home Equipment: None      Prior Function Level of Independence: Independent      Comments: Ind amb community distances without an AD, Ind with ADLs, no fall history, Drives +, works on farm   PT Goals (current goals can now be found in the care plan section) Acute Rehab PT Goals Patient Stated Goal: Go home Progress towards PT goals: Progressing toward goals    Frequency    BID      PT Plan Current plan remains appropriate       AM-PAC PT "6 Clicks" Mobility   Outcome Measure  Help needed turning from your back to your side while in a flat bed without using bedrails?: A Little Help needed moving from lying on your back to sitting on the side of a flat bed without using bedrails?: A Little Help needed moving to and from a bed to a chair (including a wheelchair)?: A Little Help needed standing up from a chair using your arms (e.g., wheelchair or bedside chair)?: A Little Help needed to walk in hospital room?: A Little Help needed climbing 3-5 steps with a railing? : A Little 6 Click Score: 18    End of Session Equipment Utilized During Treatment: Gait belt Activity Tolerance: Patient tolerated treatment well Patient left: in chair;with call bell/phone within reach;with chair alarm set;with family/visitor present Nurse Communication: Mobility status;Weight bearing status;Other (comment) PT Visit Diagnosis: Other abnormalities of gait and mobility (R26.89);Muscle weakness (generalized) (M62.81);Pain Pain - Right/Left: Left Pain - part of body: Hip     Time: 0940-1008 PT Time Calculation (min) (ACUTE ONLY): 28 min  Charges:   $Gait Training: 8-22 mins $Therapeutic Activity: 8-22 mins                    Julaine Fusi PTA 04/14/21, 12:48 PM

## 2021-04-14 NOTE — TOC Transition Note (Signed)
Transition of Care [facility]) - CM/SW Discharge Note   Patient Details  Name: Erik Moore MRN: [id number] Date of Birth: 1955-10-19  [facility] CM/SW Contact:  Pete Pelt, RN Phone Number: 04/14/2021, 1:05 PM   Clinical Narrative:   Patient to discharge today with [facility].  DME to be delivered to room     Final next level of care: [place] Barriers to Discharge: Continued Medical Work up   Patient Goals and CMS Choice     Choice offered to / list presented to : NA  Discharge Placement                       Discharge Plan and Services   Discharge Planning Services: CM Consult Post Acute Care Choice: Home Health, Durable Medical Equipment                    HH Arranged: PT, OT [facility] Agency: [place] Date [place]: 04/13/21   Representative spoke with at [place]: [place] confirmed  Social Determinants of Health ([place]) Interventions     Readmission Risk Interventions No flowsheet data found.

## 2021-04-14 NOTE — Progress Notes (Signed)
Occupational Therapy Evaluation Patient Details Name: Erik Moore MRN: [id number] DOB: [birth year] Today's Date: 04/14/2021   History of Present Illness Pt is a 65 yo M diagnosed with primary osteoarthritis of the left hip and is s/p elective L THA. PMH includes: HTN, CAD, and arthritis.   Clinical Impression    [name] was seen for OT evaluation this date, POD#1 from above surgery. Pt was independent in all ADLs prior to surgery. MIN A don underwear at EOB, assist for threading over heel, no physical assist for standing portion. CGA + RW for toilet transfer, VCs for safety and sequencing. SBA + RW for handwashing standing sinkside. SETUP don gown, sitting in chair.  Pt instructed in self care skills, falls prevention strategies, home/routines modifications, DME/AE for LB bathing and dressing tasks, and compression stocking mgt strategies. Pt would benefit from additional instruction in self care skills and techniques to help maintain precautions with or without assistive devices to support recall and carryover prior to discharge. Recommend no OT follow up.         Recommendations for follow up therapy are one component of a multi-disciplinary discharge planning process, led by the attending physician.  Recommendations may be updated based on patient status, additional functional criteria and insurance authorization.   Follow Up Recommendations  No OT follow up;Supervision - Intermittent    Equipment Recommendations  3 in 1 bedside commode    Recommendations for Other Services       Precautions / Restrictions Precautions Precautions: Anterior Hip;Fall Restrictions Weight Bearing Restrictions: Yes LLE Weight Bearing: Weight bearing as tolerated      Mobility Bed Mobility Overal bed mobility: Needs Assistance Bed Mobility: Supine to Sit     Supine to sit: Mod assist;HOB elevated     General bed mobility comments: Assist for LLE mgmt and trunk elevation, pt reports  increased stiffness this AM    Transfers Overall transfer level: Needs assistance Equipment used: Rolling walker (2 wheeled) Transfers: Sit to/from Stand Sit to Stand: Min guard              Balance Overall balance assessment: Needs assistance Sitting-balance support: Feet supported;No upper extremity supported Sitting balance-Leahy Scale: Normal     Standing balance support: No upper extremity supported;During functional activity Standing balance-Leahy Scale: Good                             ADL either performed or assessed with clinical judgement   ADL Overall ADL's : Needs assistance/impaired                                       General ADL Comments: MIN A don underwear at EOB, assist for threading over heel, no physical assist for standing portion. CGA + RW for toilet transfer, VCs for safety and sequencing. SBA + RW for handwashing standing sinkside. SETUP don gown, sitting in chair.      Pertinent Vitals/Pain Pain Assessment: Faces Faces Pain Scale: Hurts little more Pain Location: L hip Pain Descriptors / Indicators: Sore;Discomfort;Dull Pain Intervention(s): Ice applied;Repositioned;Premedicated before session     Hand Dominance Right   Extremity/Trunk Assessment Upper Extremity Assessment Upper Extremity Assessment: Overall WFL for tasks assessed   Lower Extremity Assessment Lower Extremity Assessment: Generalized weakness LLE Deficits / Details: Decreased active hip flexion       Communication Communication Communication: No  difficulties   Cognition Arousal/Alertness: Awake/alert Behavior During Therapy: WFL for tasks assessed/performed Overall Cognitive Status: Within Functional Limits for tasks assessed                                     General Comments       Exercises Exercises: Other exercises Other Exercises Other Exercises: Pt and family educated re: OT role, DME recs, d/c recs, falls  prevention, ECS, adapted dressing techniques Other Exercises: Bed mobility, LBD, toilet transfer, handwashing, ambulate to chair, don gown   Shoulder Instructions      Home Living Family/patient expects to be discharged to:: Private residence Living Arrangements: Spouse/significant other Available Help at Discharge: Family;Available 24 hours/day Type of Home: House Home Access: Level entry     Home Layout: Two level;Able to live on main level with bedroom/bathroom     Bathroom Shower/Tub: Occupational psychologist: Handicapped height     Home Equipment: None          Prior Functioning/Environment Level of Independence: Independent        Comments: Ind amb community distances without an AD, Ind with ADLs, no fall history, Drives +, works on farm        OT Problem List: Decreased strength;Decreased range of motion;Decreased activity tolerance;Impaired balance (sitting and/or standing);Decreased safety awareness;Pain      OT Treatment/Interventions: Self-care/ADL training;Therapeutic exercise;Energy conservation;DME and/or AE instruction;Therapeutic activities;Patient/family education    OT Goals(Current goals can be found in the care plan section) Acute Rehab OT Goals Patient Stated Goal: To walk better without pain OT Goal Formulation: With patient Time For Goal Achievement: 04/28/21 Potential to Achieve Goals: Good ADL Goals Pt Will Perform Grooming: Independently;standing Pt Will Perform Lower Body Dressing: with modified independence;with adaptive equipment;sit to/from stand Pt Will Transfer to Toilet: with modified independence;ambulating;regular height toilet (with LRAD PRN)  OT Frequency: Min 1X/week   Barriers to D/C:            Co-evaluation              AM-PAC OT "6 Clicks" Daily Activity     Outcome Measure Help from another person eating meals?: None Help from another person taking care of personal grooming?: A Little Help from  another person toileting, which includes using toliet, bedpan, or urinal?: A Little Help from another person bathing (including washing, rinsing, drying)?: A Little Help from another person to put on and taking off regular upper body clothing?: None Help from another person to put on and taking off regular lower body clothing?: A Little 6 Click Score: 20   End of Session Equipment Utilized During Treatment: Rolling walker  Activity Tolerance: Patient tolerated treatment well Patient left: in chair;with call bell/phone within reach;with chair alarm set;with family/visitor present  OT Visit Diagnosis: Other abnormalities of gait and mobility (R26.89);Muscle weakness (generalized) (M62.81)                Time: 9379-0240 OT Time Calculation (min): 35 min Charges:  OT General Charges $OT Visit: 1 Visit OT Evaluation $OT Eval Low Complexity: 1 Low OT Treatments $Self Care/Home Management : 23-37 mins  Nino Glow, Markus Daft 04/14/2021, 10:28 AM

## 2021-04-14 NOTE — Discharge Instructions (Signed)

## 2021-04-14 NOTE — Plan of Care (Signed)
Patient sleeping between care. Aox4. Current pain regimen effective. Dressing C/D/I. Call bell within reach.  PLAN OF CARE ONGOING Problem: Education: Goal: Knowledge of General Education information will improve Description: Including pain rating scale, medication(s)/side effects and non-pharmacologic comfort measures Outcome: Progressing   Problem: Health Behavior/Discharge Planning: Goal: Ability to manage health-related needs will improve Outcome: Progressing   Problem: Clinical Measurements: Goal: Ability to maintain clinical measurements within normal limits will improve Outcome: Progressing Goal: Will remain free from infection Outcome: Progressing Goal: Diagnostic test results will improve Outcome: Progressing Goal: Respiratory complications will improve Outcome: Progressing Goal: Cardiovascular complication will be avoided Outcome: Progressing   Problem: Activity: Goal: Risk for activity intolerance will decrease Outcome: Progressing   Problem: Nutrition: Goal: Adequate nutrition will be maintained Outcome: Progressing   Problem: Coping: Goal: Level of anxiety will decrease Outcome: Progressing   Problem: Elimination: Goal: Will not experience complications related to bowel motility Outcome: Progressing Goal: Will not experience complications related to urinary retention Outcome: Progressing   Problem: Pain Managment: Goal: General experience of comfort will improve Outcome: Progressing   Problem: Safety: Goal: Ability to remain free from injury will improve Outcome: Progressing   Problem: Skin Integrity: Goal: Risk for impaired skin integrity will decrease Outcome: Progressing   Problem: Education: Goal: Knowledge of the prescribed therapeutic regimen will improve Outcome: Progressing Goal: Understanding of discharge needs will improve Outcome: Progressing Goal: Individualized Educational Video(s) Outcome: Progressing   Problem: Activity: Goal:  Ability to avoid complications of mobility impairment will improve Outcome: Progressing Goal: Ability to tolerate increased activity will improve Outcome: Progressing   Problem: Clinical Measurements: Goal: Postoperative complications will be avoided or minimized Outcome: Progressing   Problem: Pain Management: Goal: Pain level will decrease with appropriate interventions Outcome: Progressing   Problem: Skin Integrity: Goal: Will show signs of wound healing Outcome: Progressing   Problem: Education: Goal: Knowledge of the prescribed therapeutic regimen will improve Outcome: Progressing Goal: Understanding of discharge needs will improve Outcome: Progressing Goal: Individualized Educational Video(s) Outcome: Progressing   Problem: Activity: Goal: Ability to avoid complications of mobility impairment will improve Outcome: Progressing Goal: Ability to tolerate increased activity will improve Outcome: Progressing   Problem: Clinical Measurements: Goal: Postoperative complications will be avoided or minimized Outcome: Progressing   Problem: Pain Management: Goal: Pain level will decrease with appropriate interventions Outcome: Progressing   Problem: Skin Integrity: Goal: Will show signs of wound healing Outcome: Progressing

## 2021-04-14 NOTE — Discharge Summary (Signed)
Physician Discharge Summary  Patient ID: Erik Moore MRN: [id number] DOB/AGE: [birth year] 65 y.o.  Admit date: 04/13/2021 Discharge date: 04/14/2021  Admission Diagnoses:  Status post total hip replacement, left [Z96.642]   Discharge Diagnoses: Patient Active Problem List   Diagnosis Date Noted   Status post total hip replacement, left 01/60/1093   Umbilical hernia without obstruction and without gangrene 09/16/2015    Past Medical History:  Diagnosis Date   Arthritis    Chronic kidney disease    H/O KIDNEY STONES   Colon polyp 07/17/2015   History of kidney stones    Hyperlipidemia    Hypertension    Rib fracture      Transfusion: none   Consultants (if any):   Discharged Condition: Improved  Hospital Course: Erik Moore is an 65 y.o. male who was admitted 04/13/2021 with a diagnosis of left hip osteoarthritis and went to the operating room on 04/13/2021 and underwent the above named procedures.    Surgeries: Procedure(s): TOTAL HIP ARTHROPLASTY ANTERIOR APPROACH on 04/13/2021 Patient tolerated the surgery well. Taken to PACU where she was stabilized and then transferred to the orthopedic floor.  Started on Lovenox 40 mg q 24 hrs. Foot pumps applied bilaterally at 80 mm. Heels elevated on bed with rolled towels. No evidence of DVT. Negative Homan. Physical therapy started on day #1 for gait training and transfer. OT started day #1 for ADL and assisted devices.  Patient's foley was d/c on day #1. Patient's IV  was d/c on day #1.  On post op day #1 patient was stable and ready for discharge to home with home health.    He was given perioperative antibiotics:  Anti-infectives (From admission, onward)    Start     Dose/Rate Route Frequency Ordered Stop   04/13/21 1610  ceFAZolin (ANCEF) IVPB 2g/100 mL premix        2 g 200 mL/hr over 30 Minutes Intravenous Every 6 hours 04/13/21 1231 04/13/21 2214   04/13/21 0805  ceFAZolin (ANCEF) 2-4 GM/100ML-% IVPB        Note to Pharmacy: Register, Karen   : cabinet override      04/13/21 0805 04/13/21 1031   04/13/21 0600  ceFAZolin (ANCEF) IVPB 2g/100 mL premix        2 g 200 mL/hr over 30 Minutes Intravenous On call to O.R. 04/12/21 2301 04/13/21 1010     .  He was given sequential compression devices, early ambulation, and Lovenox, teds for DVT prophylaxis.  He benefited maximally from the hospital stay and there were no complications.    Recent vital signs:  Vitals:   04/14/21 0759 04/14/21 1214  BP: 126/74 121/75  Pulse: 85 84  Resp: 15 14  Temp: 98 F (36.7 C) 98.6 F (37 C)  SpO2: 100% 97%    Recent laboratory studies:  Lab Results  Component Value Date   HGB 13.7 04/14/2021   HGB 15.0 04/13/2021   HGB 15.8 04/04/2021   Lab Results  Component Value Date   WBC 6.6 04/14/2021   PLT 125 (L) 04/14/2021   No results found for: INR Lab Results  Component Value Date   NA 133 (L) 04/14/2021   K 3.6 04/14/2021   CL 99 04/14/2021   CO2 28 04/14/2021   BUN 17 04/14/2021   CREATININE 0.91 04/14/2021   GLUCOSE 167 (H) 04/14/2021    Discharge Medications:   Allergies as of 04/14/2021   No Known Allergies      Medication List  TAKE these medications    acetaminophen 500 MG tablet Commonly known as: TYLENOL Take 2 tablets (1,000 mg total) by mouth every 8 (eight) hours. What changed:  when to take this reasons to take this   cholecalciferol 25 MCG (1000 UNIT) tablet Commonly known as: VITAMIN D3 Take 1,000 Units by mouth daily.   docusate sodium 100 MG capsule Commonly known as: COLACE Take 1 capsule (100 mg total) by mouth 2 (two) times daily.   enoxaparin 40 MG/0.4ML injection Commonly known as: LOVENOX Inject 0.4 mLs (40 mg total) into the skin daily for 14 days. Start taking on: April 15, 2021   fenofibrate 48 MG tablet Commonly known as: TRICOR Take 48 mg by mouth daily.   hydrochlorothiazide 25 MG tablet Commonly known as: HYDRODIURIL Take 25 mg  by mouth daily.   HYDROcodone-acetaminophen 5-325 MG tablet Commonly known as: NORCO/VICODIN Take 1-2 tablets by mouth every 4 (four) hours as needed for moderate pain (pain score 4-6).   indomethacin 50 MG capsule Commonly known as: INDOCIN Take 50 mg by mouth 3 (three) times daily as needed (gout).   losartan 50 MG tablet Commonly known as: COZAAR Take 50 mg by mouth every morning.   methocarbamol 500 MG tablet Commonly known as: ROBAXIN Take 1 tablet (500 mg total) by mouth every 6 (six) hours as needed for muscle spasms.   polyethylene glycol 17 g packet Commonly known as: MIRALAX / GLYCOLAX Take 17 g by mouth daily as needed for mild constipation.   traMADol 50 MG tablet Commonly known as: ULTRAM Take 1 tablet (50 mg total) by mouth every 6 (six) hours as needed.   vitamin B-12 1000 MCG tablet Commonly known as: CYANOCOBALAMIN Take 1,000 mcg by mouth daily.               Durable Medical Equipment  (From admission, onward)           Start     Ordered   04/13/21 1232  DME Walker rolling  Once       Question Answer Comment  Walker: With [employer] Wheels   Patient needs a walker to treat with the following condition Status post total hip replacement, left      04/13/21 1231   04/13/21 1232  DME 3 n 1  Once        04/13/21 1231   04/13/21 1232  DME Bedside commode  Once       Question:  Patient needs a bedside commode to treat with the following condition  Answer:  Status post total hip replacement, left   04/13/21 1231            Diagnostic Studies: DG HIP OPERATIVE UNILAT W OR W/O PELVIS LEFT  Result Date: 04/13/2021 CLINICAL DATA:  Left hip arthroplasty EXAM: OPERATIVE left HIP (WITH PELVIS IF PERFORMED) 1 VIEW TECHNIQUE: Fluoroscopic spot image(s) were submitted for interpretation post-operatively. COMPARISON:  Subsequently obtained postoperative radiograph FINDINGS: Nine C-arm fluoroscopic images (though all nine images are a single view) were  obtained intraoperatively and submitted for post operative interpretation. The patient is status post left hip arthroplasty. Hardware alignment is within expected limits, without evidence of immediate complication. Fluoro time 18 seconds. Please see the performing provider's procedural report for further detail. IMPRESSION: Status post left hip arthroplasty. Electronically Signed   By: Valetta Mole M.D.   On: 04/13/2021 12:27   DG HIP UNILAT W OR W/O PELVIS 2-3 VIEWS LEFT  Result Date: 04/13/2021 CLINICAL DATA:  Left total hip arthroplasty EXAM: DG HIP (WITH OR WITHOUT PELVIS) 2-3V LEFT COMPARISON:  Intraoperative radiographs obtained earlier the same day FINDINGS: The patient is status post left hip arthroplasty. Hardware alignment is within expected limits, without evidence of immediate complication. Expected postsurgical changes including overlying skin staples, soft tissue swelling and soft tissue gas, and a drain are noted. IMPRESSION: Status post left hip arthroplasty without evidence of immediate complication. Electronically Signed   By: Valetta Mole M.D.   On: 04/13/2021 12:26    Disposition:      Follow-up Information     Duanne Guess, PA-C Follow up in 2 week(s).   Specialties: Orthopedic Surgery, Emergency Medicine Contact information: [place] [address] [phone]                  Signed: Feliberto Gottron 04/14/2021, 12:24 PM

## 2021-04-14 NOTE — Anesthesia Postprocedure Evaluation (Signed)
Anesthesia Post Note  Patient: Erik Moore  Procedure(s) Performed: TOTAL HIP ARTHROPLASTY ANTERIOR APPROACH (Left: Hip)  Patient location during evaluation: Nursing Unit Anesthesia Type: Spinal Level of consciousness: oriented and awake and alert Pain management: pain level controlled Vital Signs Assessment: post-procedure vital signs reviewed and stable Respiratory status: spontaneous breathing and respiratory function stable Cardiovascular status: blood pressure returned to baseline and stable Postop Assessment: no headache, no backache, no apparent nausea or vomiting and patient able to bend at knees Anesthetic complications: no   No notable events documented.   Last Vitals:  Vitals:   04/13/21 1930 04/14/21 0609  BP: 124/77 (!) 141/79  Pulse: 97 99  Resp: 17 17  Temp: 36.7 C 37.1 C  SpO2: 100% 100%    Last Pain:  Vitals:   04/13/21 2050  TempSrc:   PainSc: 4                  Brantley Fling

## 2021-04-14 NOTE — Plan of Care (Signed)
  Problem: Education: Goal: Knowledge of General Education information will improve Description: Including pain rating scale, medication(s)/side effects and non-pharmacologic comfort measures Outcome: Progressing   Problem: Health Behavior/Discharge Planning: Goal: Ability to manage health-related needs will improve Outcome: Progressing   Problem: Clinical Measurements: Goal: Ability to maintain clinical measurements within normal limits will improve Outcome: Progressing Goal: Will remain free from infection Outcome: Progressing Goal: Diagnostic test results will improve Outcome: Progressing Goal: Respiratory complications will improve Outcome: Progressing Goal: Cardiovascular complication will be avoided Outcome: Progressing   Problem: Activity: Goal: Risk for activity intolerance will decrease Outcome: Progressing   Problem: Nutrition: Goal: Adequate nutrition will be maintained Outcome: Progressing   Problem: Coping: Goal: Level of anxiety will decrease Outcome: Progressing   Problem: Elimination: Goal: Will not experience complications related to bowel motility Outcome: Progressing Goal: Will not experience complications related to urinary retention Outcome: Progressing   Problem: Pain Managment: Goal: General experience of comfort will improve Outcome: Progressing   Problem: Safety: Goal: Ability to remain free from injury will improve Outcome: Progressing   Problem: Skin Integrity: Goal: Risk for impaired skin integrity will decrease Outcome: Progressing   Problem: Education: Goal: Knowledge of the prescribed therapeutic regimen will improve Outcome: Progressing Goal: Understanding of discharge needs will improve Outcome: Progressing Goal: Individualized Educational Video(s) Outcome: Progressing   Problem: Activity: Goal: Ability to avoid complications of mobility impairment will improve Outcome: Progressing Goal: Ability to tolerate increased  activity will improve Outcome: Progressing   Problem: Clinical Measurements: Goal: Postoperative complications will be avoided or minimized Outcome: Progressing   Problem: Pain Management: Goal: Pain level will decrease with appropriate interventions Outcome: Progressing   Problem: Skin Integrity: Goal: Will show signs of wound healing Outcome: Progressing   Problem: Education: Goal: Knowledge of the prescribed therapeutic regimen will improve Outcome: Progressing Goal: Understanding of discharge needs will improve Outcome: Progressing Goal: Individualized Educational Video(s) Outcome: Progressing   Problem: Activity: Goal: Ability to avoid complications of mobility impairment will improve Outcome: Progressing Goal: Ability to tolerate increased activity will improve Outcome: Progressing   Problem: Clinical Measurements: Goal: Postoperative complications will be avoided or minimized Outcome: Progressing   Problem: Pain Management: Goal: Pain level will decrease with appropriate interventions Outcome: Progressing   Problem: Skin Integrity: Goal: Will show signs of wound healing Outcome: Progressing   

## 2021-04-14 NOTE — Plan of Care (Signed)
Problem: Education: Goal: Knowledge of General Education information will improve Description: Including pain rating scale, medication(s)/side effects and non-pharmacologic comfort measures 04/14/2021 1101 by Trula Slade, RN Outcome: Progressing 04/14/2021 0734 by Trula Slade, RN Outcome: Progressing   Problem: Health Behavior/Discharge Planning: Goal: Ability to manage health-related needs will improve 04/14/2021 1101 by Trula Slade, RN Outcome: Progressing 04/14/2021 0734 by Trula Slade, RN Outcome: Progressing   Problem: Clinical Measurements: Goal: Ability to maintain clinical measurements within normal limits will improve 04/14/2021 1101 by Trula Slade, RN Outcome: Progressing 04/14/2021 0734 by Trula Slade, RN Outcome: Progressing Goal: Will remain free from infection 04/14/2021 1101 by Trula Slade, RN Outcome: Progressing 04/14/2021 0734 by Trula Slade, RN Outcome: Progressing Goal: Diagnostic test results will improve 04/14/2021 1101 by Trula Slade, RN Outcome: Progressing 04/14/2021 0734 by Trula Slade, RN Outcome: Progressing Goal: Respiratory complications will improve 04/14/2021 1101 by Trula Slade, RN Outcome: Progressing 04/14/2021 0734 by Trula Slade, RN Outcome: Progressing Goal: Cardiovascular complication will be avoided 04/14/2021 1101 by Trula Slade, RN Outcome: Progressing 04/14/2021 0734 by Trula Slade, RN Outcome: Progressing   Problem: Activity: Goal: Risk for activity intolerance will decrease 04/14/2021 1101 by Trula Slade, RN Outcome: Progressing 04/14/2021 0734 by Trula Slade, RN Outcome: Progressing   Problem: Nutrition: Goal: Adequate nutrition will be maintained 04/14/2021 1101 by Trula Slade, RN Outcome: Progressing 04/14/2021 0734 by Trula Slade, RN Outcome: Progressing   Problem: Coping: Goal: Level of anxiety will decrease 04/14/2021  1101 by Trula Slade, RN Outcome: Progressing 04/14/2021 0734 by Trula Slade, RN Outcome: Progressing   Problem: Elimination: Goal: Will not experience complications related to bowel motility 04/14/2021 1101 by Trula Slade, RN Outcome: Progressing 04/14/2021 0734 by Trula Slade, RN Outcome: Progressing Goal: Will not experience complications related to urinary retention 04/14/2021 1101 by Trula Slade, RN Outcome: Progressing 04/14/2021 0734 by Trula Slade, RN Outcome: Progressing   Problem: Pain Managment: Goal: General experience of comfort will improve 04/14/2021 1101 by Trula Slade, RN Outcome: Progressing 04/14/2021 0734 by Trula Slade, RN Outcome: Progressing   Problem: Safety: Goal: Ability to remain free from injury will improve 04/14/2021 1101 by Trula Slade, RN Outcome: Progressing 04/14/2021 0734 by Trula Slade, RN Outcome: Progressing   Problem: Skin Integrity: Goal: Risk for impaired skin integrity will decrease 04/14/2021 1101 by Trula Slade, RN Outcome: Progressing 04/14/2021 0734 by Trula Slade, RN Outcome: Progressing   Problem: Education: Goal: Knowledge of the prescribed therapeutic regimen will improve 04/14/2021 1101 by Trula Slade, RN Outcome: Progressing 04/14/2021 0734 by Trula Slade, RN Outcome: Progressing Goal: Understanding of discharge needs will improve 04/14/2021 1101 by Trula Slade, RN Outcome: Progressing 04/14/2021 0734 by Trula Slade, RN Outcome: Progressing Goal: Individualized Educational Video(s) 04/14/2021 1101 by Trula Slade, RN Outcome: Progressing 04/14/2021 0734 by Trula Slade, RN Outcome: Progressing   Problem: Activity: Goal: Ability to avoid complications of mobility impairment will improve 04/14/2021 1101 by Trula Slade, RN Outcome: Progressing 04/14/2021 0734 by Trula Slade, RN Outcome: Progressing Goal: Ability  to tolerate increased activity will improve 04/14/2021 1101 by Trula Slade, RN Outcome: Progressing 04/14/2021 0734 by Trula Slade, RN Outcome: Progressing   Problem: Clinical Measurements: Goal: Postoperative complications will be avoided or minimized 04/14/2021 1101 by Trula Slade, RN Outcome: Progressing 04/14/2021 0734 by Linward Foster  C, RN Outcome: Progressing

## 2021-04-19 ENCOUNTER — Encounter: Payer: Self-pay | Admitting: Orthopedic Surgery

## 2021-04-19 MED ORDER — HEMOSTATIC AGENTS (NO CHARGE) OPTIME
TOPICAL | Status: DC | PRN
  Administered 2021-04-13: 2 via TOPICAL

## 2021-05-16 ENCOUNTER — Other Ambulatory Visit: Payer: Self-pay | Admitting: Orthopedic Surgery

## 2021-05-16 DIAGNOSIS — M25312 Other instability, left shoulder: Secondary | ICD-10-CM

## 2021-05-16 DIAGNOSIS — S46002D Unspecified injury of muscle(s) and tendon(s) of the rotator cuff of left shoulder, subsequent encounter: Secondary | ICD-10-CM

## 2021-05-24 ENCOUNTER — Other Ambulatory Visit: Payer: Self-pay

## 2021-05-24 ENCOUNTER — Ambulatory Visit
Admission: RE | Admit: 2021-05-24 | Discharge: 2021-05-24 | Disposition: A | Discharge status: Home / Self Care | Payer: Medicare Other | Source: Ambulatory Visit | Attending: Orthopedic Surgery | Admitting: Orthopedic Surgery

## 2021-05-24 DIAGNOSIS — S46002D Unspecified injury of muscle(s) and tendon(s) of the rotator cuff of left shoulder, subsequent encounter: Secondary | ICD-10-CM | POA: Diagnosis present

## 2021-05-24 DIAGNOSIS — M25312 Other instability, left shoulder: Secondary | ICD-10-CM | POA: Diagnosis present

## 2021-09-04 DIAGNOSIS — M6281 Muscle weakness (generalized): Secondary | ICD-10-CM | POA: Diagnosis not present

## 2021-09-04 DIAGNOSIS — M25561 Pain in right knee: Secondary | ICD-10-CM | POA: Diagnosis not present

## 2021-09-04 DIAGNOSIS — Z96642 Presence of left artificial hip joint: Secondary | ICD-10-CM | POA: Diagnosis not present

## 2021-09-04 DIAGNOSIS — M25562 Pain in left knee: Secondary | ICD-10-CM | POA: Diagnosis not present

## 2021-09-12 DIAGNOSIS — M6281 Muscle weakness (generalized): Secondary | ICD-10-CM | POA: Diagnosis not present

## 2021-09-12 DIAGNOSIS — G8929 Other chronic pain: Secondary | ICD-10-CM | POA: Diagnosis not present

## 2021-09-12 DIAGNOSIS — M25552 Pain in left hip: Secondary | ICD-10-CM | POA: Diagnosis not present

## 2021-09-12 DIAGNOSIS — M25652 Stiffness of left hip, not elsewhere classified: Secondary | ICD-10-CM | POA: Diagnosis not present

## 2021-09-12 DIAGNOSIS — R2689 Other abnormalities of gait and mobility: Secondary | ICD-10-CM | POA: Diagnosis not present

## 2021-09-14 DIAGNOSIS — M25652 Stiffness of left hip, not elsewhere classified: Secondary | ICD-10-CM | POA: Diagnosis not present

## 2021-09-14 DIAGNOSIS — G8929 Other chronic pain: Secondary | ICD-10-CM | POA: Diagnosis not present

## 2021-09-14 DIAGNOSIS — M25552 Pain in left hip: Secondary | ICD-10-CM | POA: Diagnosis not present

## 2021-09-14 DIAGNOSIS — M6281 Muscle weakness (generalized): Secondary | ICD-10-CM | POA: Diagnosis not present

## 2021-09-18 DIAGNOSIS — R2689 Other abnormalities of gait and mobility: Secondary | ICD-10-CM | POA: Diagnosis not present

## 2021-09-18 DIAGNOSIS — M25652 Stiffness of left hip, not elsewhere classified: Secondary | ICD-10-CM | POA: Diagnosis not present

## 2021-09-18 DIAGNOSIS — G8929 Other chronic pain: Secondary | ICD-10-CM | POA: Diagnosis not present

## 2021-09-18 DIAGNOSIS — M25552 Pain in left hip: Secondary | ICD-10-CM | POA: Diagnosis not present

## 2021-09-18 DIAGNOSIS — M6281 Muscle weakness (generalized): Secondary | ICD-10-CM | POA: Diagnosis not present

## 2021-09-21 DIAGNOSIS — G8929 Other chronic pain: Secondary | ICD-10-CM | POA: Diagnosis not present

## 2021-09-21 DIAGNOSIS — M25552 Pain in left hip: Secondary | ICD-10-CM | POA: Diagnosis not present

## 2021-09-21 DIAGNOSIS — M25652 Stiffness of left hip, not elsewhere classified: Secondary | ICD-10-CM | POA: Diagnosis not present

## 2021-09-21 DIAGNOSIS — R2689 Other abnormalities of gait and mobility: Secondary | ICD-10-CM | POA: Diagnosis not present

## 2021-09-21 DIAGNOSIS — M6281 Muscle weakness (generalized): Secondary | ICD-10-CM | POA: Diagnosis not present

## 2021-09-25 DIAGNOSIS — D696 Thrombocytopenia, unspecified: Secondary | ICD-10-CM | POA: Diagnosis not present

## 2021-09-25 DIAGNOSIS — M25552 Pain in left hip: Secondary | ICD-10-CM | POA: Diagnosis not present

## 2021-09-25 DIAGNOSIS — M25652 Stiffness of left hip, not elsewhere classified: Secondary | ICD-10-CM | POA: Diagnosis not present

## 2021-09-25 DIAGNOSIS — M6281 Muscle weakness (generalized): Secondary | ICD-10-CM | POA: Diagnosis not present

## 2021-09-25 DIAGNOSIS — R2689 Other abnormalities of gait and mobility: Secondary | ICD-10-CM | POA: Diagnosis not present

## 2021-09-25 DIAGNOSIS — G8929 Other chronic pain: Secondary | ICD-10-CM | POA: Diagnosis not present

## 2021-09-25 DIAGNOSIS — R7401 Elevation of levels of liver transaminase levels: Secondary | ICD-10-CM | POA: Diagnosis not present

## 2021-09-25 DIAGNOSIS — Z86010 Personal history of colonic polyps: Secondary | ICD-10-CM | POA: Diagnosis not present

## 2021-10-02 DIAGNOSIS — M25552 Pain in left hip: Secondary | ICD-10-CM | POA: Diagnosis not present

## 2021-10-02 DIAGNOSIS — G8929 Other chronic pain: Secondary | ICD-10-CM | POA: Diagnosis not present

## 2021-10-02 DIAGNOSIS — M6281 Muscle weakness (generalized): Secondary | ICD-10-CM | POA: Diagnosis not present

## 2021-10-02 DIAGNOSIS — M25652 Stiffness of left hip, not elsewhere classified: Secondary | ICD-10-CM | POA: Diagnosis not present

## 2021-10-10 DIAGNOSIS — M25552 Pain in left hip: Secondary | ICD-10-CM | POA: Diagnosis not present

## 2021-10-10 DIAGNOSIS — M25652 Stiffness of left hip, not elsewhere classified: Secondary | ICD-10-CM | POA: Diagnosis not present

## 2021-10-10 DIAGNOSIS — G8929 Other chronic pain: Secondary | ICD-10-CM | POA: Diagnosis not present

## 2021-10-10 DIAGNOSIS — R2689 Other abnormalities of gait and mobility: Secondary | ICD-10-CM | POA: Diagnosis not present

## 2021-10-10 DIAGNOSIS — M6281 Muscle weakness (generalized): Secondary | ICD-10-CM | POA: Diagnosis not present

## 2021-10-23 DIAGNOSIS — M6281 Muscle weakness (generalized): Secondary | ICD-10-CM | POA: Diagnosis not present

## 2021-10-23 DIAGNOSIS — M25552 Pain in left hip: Secondary | ICD-10-CM | POA: Diagnosis not present

## 2021-10-23 DIAGNOSIS — M25652 Stiffness of left hip, not elsewhere classified: Secondary | ICD-10-CM | POA: Diagnosis not present

## 2021-10-23 DIAGNOSIS — G8929 Other chronic pain: Secondary | ICD-10-CM | POA: Diagnosis not present

## 2021-10-23 DIAGNOSIS — R2689 Other abnormalities of gait and mobility: Secondary | ICD-10-CM | POA: Diagnosis not present

## 2021-10-26 ENCOUNTER — Ambulatory Visit (INDEPENDENT_AMBULATORY_CARE_PROVIDER_SITE_OTHER): Payer: No Typology Code available for payment source | Admitting: Dermatology

## 2021-10-26 ENCOUNTER — Encounter: Payer: Self-pay | Admitting: Dermatology

## 2021-10-26 DIAGNOSIS — L578 Other skin changes due to chronic exposure to nonionizing radiation: Secondary | ICD-10-CM

## 2021-10-26 DIAGNOSIS — L821 Other seborrheic keratosis: Secondary | ICD-10-CM

## 2021-10-26 DIAGNOSIS — I781 Nevus, non-neoplastic: Secondary | ICD-10-CM | POA: Diagnosis not present

## 2021-10-26 DIAGNOSIS — L570 Actinic keratosis: Secondary | ICD-10-CM

## 2021-10-26 NOTE — Patient Instructions (Addendum)
Cryotherapy Aftercare ? ?Wash gently with soap and water everyday.   ?Apply Vaseline and Band-Aid daily until healed.  ? ?Prior to procedure, discussed risks of blister formation, small wound, skin dyspigmentation, or rare scar following cryotherapy. Recommend Vaseline ointment to treated areas while healing.  ? ? ?Recommend daily broad spectrum sunscreen SPF 30+ to sun-exposed areas, reapply every 2 hours as needed. Call for new or changing lesions.  ?Staying in the shade or wearing long sleeves, sun glasses (UVA+UVB protection) and wide brim hats (4-inch brim around the entire circumference of the hat) are also recommended for sun protection.  ? ? ?If You Need Anything After Your Visit ? ?If you have any questions or concerns for your doctor, please call our main line at [phone] and press option 4 to reach your doctor's medical assistant. If no one answers, please leave a voicemail as directed and we will return your call as soon as possible. Messages left after 4 pm will be answered the following business day.  ? ?You may also send [place] a message via MyChart. We typically respond to MyChart messages within 1-2 business days. ? ?For prescription refills, please ask your pharmacy to contact our office. Our fax number is [phone]. ? ?If you have an urgent issue when the clinic is closed that cannot wait until the next business day, you can page your doctor at the number below.   ? ?Please note that while we do our best to be available for urgent issues outside of office hours, we are not available 24/7.  ? ?If you have an urgent issue and are unable to reach [place], you may choose to seek medical care at your doctor's office, retail clinic, urgent care center, or emergency room. ? ?If you have a medical emergency, please immediately call 911 or go to the emergency department. ? ?Pager Numbers ? ?- Dr. Nehemiah Massed: [phone] ? ?- Dr. Laurence Ferrari: [phone] ? ?- Dr. Nicole Kindred: [phone] ? ?In the event of inclement  weather, please call our main line at [phone] for an update on the status of any delays or closures. ? ?Dermatology Medication Tips: ?Please keep the boxes that topical medications come in in order to help keep track of the instructions about where and how to use these. Pharmacies typically print the medication instructions only on the boxes and not directly on the medication tubes.  ? ?If your medication is too expensive, please contact our office at [phone] option 4 or send [place] a message through [place].  ? ?We are unable to tell what your co-pay for medications will be in advance as this is different depending on your insurance coverage. However, we may be able to find a substitute medication at lower cost or fill out paperwork to get insurance to cover a needed medication.  ? ?If a prior authorization is required to get your medication covered by your insurance company, please allow [place] 1-2 business days to complete this process. ? ?Drug prices often vary depending on where the prescription is filled and some pharmacies may offer cheaper prices. ? ?The website www.goodrx.com contains coupons for medications through different pharmacies. The prices here do not account for what the cost may be with help from insurance (it may be cheaper with your insurance), but the website can give you the price if you did not use any insurance.  ?- You can print the associated coupon and take it with your prescription to the pharmacy.  ?- You may also stop by our office  during regular business hours and pick up a GoodRx coupon card.  ?- If you need your prescription sent electronically to a different pharmacy, notify our office through [facility] or by phone at [phone] option 4. ? ? ? ? ?Si Usted Necesita Algo Despu?s de Su Visita ? ?Tambi?n puede enviarnos un mensaje a trav?s de MyChart. Por lo general respondemos a los mensajes de MyChart en el transcurso de 1 a 2 d?as h?biles. ? ?Para renovar recetas,  por favor pida a su farmacia que se ponga en contacto con nuestra oficina. Nuestro n?mero de fax es el [phone]. ? ?Si tiene un asunto urgente cuando la cl?nica est? cerrada y que no puede esperar hasta el siguiente d?a h?bil, puede llamar/localizar a su doctor(a) al n?mero que aparece a continuaci?n.  ? ?Por favor, tenga en cuenta que aunque hacemos todo lo posible para estar disponibles para asuntos urgentes fuera del horario de oficina, no estamos disponibles las 24 horas del d?a, los 7 d?as de la semana.  ? ?Si tiene un problema urgente y no puede comunicarse con nosotros, puede optar por buscar atenci?n m?dica  en el consultorio de su doctor(a), en una cl?nica privada, en un centro de atenci?n urgente o en una sala de emergencias. ? ?Si tiene Engineer, maintenance (IT) m?dica, por favor llame inmediatamente al 911 o vaya a la sala de emergencias. ? ?N?meros de b?per ? ?- Dr. Nehemiah Massed: [phone] ? ?- Dra. Moye: [phone] ? ?- Dra. Nicole Kindred: [phone] ? ?En caso de inclemencias del tiempo, por favor llame a nuestra l?nea principal al [phone] para una actualizaci?n sobre el estado de cualquier retraso o cierre. ? ?Consejos para la medicaci?n en dermatolog?a: ?Por favor, guarde las cajas en las que vienen los medicamentos de uso t?pico para ayudarle a seguir las instrucciones sobre d?nde y c?mo usarlos. Las farmacias generalmente imprimen las instrucciones del medicamento s?lo en las cajas y no directamente en los tubos del [place].  ? ?Si su medicamento es muy caro, por favor, p?ngase en contacto con Zigmund Daniel llamando al [phone] y presione la opci?n 4 o env?enos un mensaje a trav?s de MyChart.  ? ?No podemos decirle cu?l ser? su copago por los medicamentos por adelantado ya que esto es diferente dependiendo de la cobertura de su seguro. Sin embargo, es posible que podamos encontrar un medicamento sustituto a Electrical engineer un formulario para que el seguro cubra el medicamento que se  considera necesario.  ? ?Si se requiere Ardelia Mems autorizaci?n previa para que su compa??a de seguros [place] su medicamento, por favor perm?tanos de 1 a 2 d?as h?biles para completar este proceso. ? ?Los precios de los medicamentos var?an con frecuencia dependiendo del Environmental consultant de d?nde se surte la receta y alguna farmacias pueden ofrecer precios m?s baratos. ? ?El sitio web www.goodrx.com tiene cupones para medicamentos de Airline pilot. Los precios aqu? no tienen en cuenta lo que podr?a costar con la ayuda del seguro (puede ser m?s barato con su seguro), pero el sitio web puede darle el precio si no utiliz? ning?n seguro.  ?- Puede imprimir el cup?n correspondiente y llevarlo con su receta a la farmacia.  ?- Tambi?n puede pasar por nuestra oficina durante el horario de atenci?n regular y recoger una tarjeta de cupones de GoodRx.  ?- Si necesita que su receta se env?e electr?nicamente a Chiropodist, informe a nuestra oficina a trav?s de MyChart de [place] o por tel?fono llamando al [phone] y presione la opci?n 4.  ?

## 2021-10-26 NOTE — Progress Notes (Signed)
? ?  New Patient Visit ? ?Subjective  ?Erik Moore is a 66 y.o. male who presents for the following: lesion (Mid top of scalp. Dur: 1 month. Non tender, denies itch. Raised, rough). ?The patient has spots, moles and lesions to be evaluated, some may be new or changing and the patient has concerns that these could be cancer. ? ?Review of Systems: No other skin or systemic complaints except as noted in HPI or Assessment and Plan. ? ?Objective  ?Well appearing patient in no apparent distress; mood and affect are within normal limits. ? ?A focused examination was performed including head, including the scalp, face, neck, nose, ears, eyelids, and lips. Relevant physical exam findings are noted in the Assessment and Plan. ? ?Mid Frontal Scalp x1, left malar cheek x1 (2) ?Erythematous thin papules/macules with gritty scale.  ? ?Right Hand - Posterior ?Stuck-on, waxy, tan-brown papule or plaque --Discussed benign etiology and prognosis.  ? ?face ?Scattered superficial vessels ? ? ?Assessment & Plan  ? ?Actinic Damage ?- chronic, secondary to cumulative UV radiation exposure/sun exposure over time ?- diffuse scaly erythematous macules with underlying dyspigmentation ?- Recommend daily broad spectrum sunscreen SPF 30+ to sun-exposed areas, reapply every 2 hours as needed.  ?- Recommend staying in the shade or wearing long sleeves, sun glasses (UVA+UVB protection) and wide brim hats (4-inch brim around the entire circumference of the hat). ?- Call for new or changing lesions. ? ?AK (actinic keratosis) (2) ?Mid Frontal Scalp x1, left malar cheek x1 ?Actinic keratoses are precancerous spots that appear secondary to cumulative UV radiation exposure/sun exposure over time. They are chronic with expected duration over 1 year. A portion of actinic keratoses will progress to squamous cell carcinoma of the skin. It is not possible to reliably predict which spots will progress to skin cancer and so treatment is recommended to  prevent development of skin cancer. ?Recommend daily broad spectrum sunscreen SPF 30+ to sun-exposed areas, reapply every 2 hours as needed.  ?Recommend staying in the shade or wearing long sleeves, sun glasses (UVA+UVB protection) and wide brim hats (4-inch brim around the entire circumference of the hat). ?Call for new or changing lesions. ? ?Destruction of lesion - Mid Frontal Scalp x1, left malar cheek x1 ?Complexity: simple   ?Destruction method: cryotherapy   ?Informed consent: discussed and consent obtained   ?Timeout:  patient name, date of birth, surgical site, and procedure verified ?Lesion destroyed using liquid nitrogen: Yes   ?Region frozen until ice ball extended beyond lesion: Yes   ?Outcome: patient tolerated procedure well with no complications   ?Post-procedure details: wound care instructions given   ? ?Seborrheic keratosis ?Right Hand - Posterior ?Reassured benign age-related growth.  Recommend observation.  Discussed cryotherapy if spot(s) become irritated or inflamed. ? ?Telangiectasias ?face ?Discussed the treatment option of BBL/laser.  Typically we recommend 1-3 treatment sessions about 5-8 weeks apart for best results.  The patient's condition may require "maintenance treatments" in the future.  The fee for BBL / laser treatments is $350 per treatment session for the whole face.  A fee can be quoted for other parts of the body. ?Insurance typically does not pay for BBL/laser treatments and therefore the fee is an out-of-pocket cost.  ? ?Return in about 3 months (around 01/25/2022) for AK Follow Up. ? ?I, Emelia Salisbury, CMA, am acting as scribe for Sarina Ser, MD. ?Documentation: I have reviewed the above documentation for accuracy and completeness, and I agree with the above. ? ?Sarina Ser, MD ? ?

## 2021-11-01 DIAGNOSIS — M6281 Muscle weakness (generalized): Secondary | ICD-10-CM | POA: Diagnosis not present

## 2021-11-01 DIAGNOSIS — M25552 Pain in left hip: Secondary | ICD-10-CM | POA: Diagnosis not present

## 2021-11-01 DIAGNOSIS — G8929 Other chronic pain: Secondary | ICD-10-CM | POA: Diagnosis not present

## 2021-11-01 DIAGNOSIS — R2689 Other abnormalities of gait and mobility: Secondary | ICD-10-CM | POA: Diagnosis not present

## 2021-11-01 DIAGNOSIS — M25652 Stiffness of left hip, not elsewhere classified: Secondary | ICD-10-CM | POA: Diagnosis not present

## 2021-11-03 ENCOUNTER — Encounter: Payer: Self-pay | Admitting: Dermatology

## 2021-12-28 ENCOUNTER — Encounter: Payer: Self-pay | Admitting: *Deleted

## 2021-12-29 ENCOUNTER — Ambulatory Visit
Admission: RE | Admit: 2021-12-29 | Discharge: 2021-12-29 | Disposition: A | Discharge status: Home / Self Care | Payer: No Typology Code available for payment source | Attending: Gastroenterology | Admitting: Gastroenterology

## 2021-12-29 ENCOUNTER — Ambulatory Visit: Payer: No Typology Code available for payment source | Admitting: Anesthesiology

## 2021-12-29 ENCOUNTER — Encounter: Payer: Self-pay | Admitting: *Deleted

## 2021-12-29 ENCOUNTER — Encounter
Admission: RE | Disposition: A | Discharge status: Home / Self Care | Payer: Self-pay | Source: Home / Self Care | Attending: Gastroenterology

## 2021-12-29 DIAGNOSIS — K5730 Diverticulosis of large intestine without perforation or abscess without bleeding: Secondary | ICD-10-CM | POA: Insufficient documentation

## 2021-12-29 DIAGNOSIS — D123 Benign neoplasm of transverse colon: Secondary | ICD-10-CM | POA: Insufficient documentation

## 2021-12-29 DIAGNOSIS — K641 Second degree hemorrhoids: Secondary | ICD-10-CM | POA: Diagnosis not present

## 2021-12-29 DIAGNOSIS — K219 Gastro-esophageal reflux disease without esophagitis: Secondary | ICD-10-CM | POA: Diagnosis not present

## 2021-12-29 DIAGNOSIS — D125 Benign neoplasm of sigmoid colon: Secondary | ICD-10-CM | POA: Insufficient documentation

## 2021-12-29 DIAGNOSIS — K635 Polyp of colon: Secondary | ICD-10-CM | POA: Insufficient documentation

## 2021-12-29 DIAGNOSIS — Z86010 Personal history of colonic polyps: Secondary | ICD-10-CM | POA: Insufficient documentation

## 2021-12-29 DIAGNOSIS — N189 Chronic kidney disease, unspecified: Secondary | ICD-10-CM | POA: Diagnosis not present

## 2021-12-29 DIAGNOSIS — Z1211 Encounter for screening for malignant neoplasm of colon: Secondary | ICD-10-CM | POA: Insufficient documentation

## 2021-12-29 DIAGNOSIS — I129 Hypertensive chronic kidney disease with stage 1 through stage 4 chronic kidney disease, or unspecified chronic kidney disease: Secondary | ICD-10-CM | POA: Diagnosis not present

## 2021-12-29 HISTORY — PX: COLONOSCOPY WITH PROPOFOL: SHX5780

## 2021-12-29 HISTORY — DX: Gout, unspecified: M10.9

## 2021-12-29 HISTORY — DX: Umbilical hernia without obstruction or gangrene: K42.9

## 2021-12-29 SURGERY — COLONOSCOPY WITH PROPOFOL
Anesthesia: General

## 2021-12-29 MED ORDER — PROPOFOL 10 MG/ML IV BOLUS
INTRAVENOUS | Status: DC | PRN
  Administered 2021-12-29: 70 mg via INTRAVENOUS

## 2021-12-29 MED ORDER — PROPOFOL 1000 MG/100ML IV EMUL
INTRAVENOUS | Status: AC
  Filled 2021-12-29: qty 100

## 2021-12-29 MED ORDER — SODIUM CHLORIDE 0.9 % IV SOLN
2.0000 g | Freq: Once | INTRAVENOUS | Status: AC
  Administered 2021-12-29: 2 g via INTRAVENOUS
  Filled 2021-12-29: qty 2

## 2021-12-29 MED ORDER — PROPOFOL 500 MG/50ML IV EMUL
INTRAVENOUS | Status: DC | PRN
  Administered 2021-12-29: 175 ug/kg/min via INTRAVENOUS

## 2021-12-29 MED ORDER — LIDOCAINE HCL (CARDIAC) PF 100 MG/5ML IV SOSY
PREFILLED_SYRINGE | INTRAVENOUS | Status: DC | PRN
  Administered 2021-12-29: 50 mg via INTRAVENOUS

## 2021-12-29 MED ORDER — SODIUM CHLORIDE 0.9 % IV SOLN: INTRAVENOUS | Status: DC

## 2021-12-29 NOTE — Transfer of Care (Signed)
Immediate Anesthesia Transfer of Care Note  Patient: Erik Moore  Procedure(s) Performed: COLONOSCOPY WITH PROPOFOL  Patient Location: PACU  Anesthesia Type:General  Level of Consciousness: awake, alert  and drowsy  Airway & Oxygen Therapy: Patient Spontanous Breathing  Post-op Assessment: Report given to RN and Post -op Vital signs reviewed and stable  Post vital signs: Reviewed and stable  Last Vitals:  Vitals Value Taken Time  BP 100/70 12/29/21 0932  Temp    Pulse 78 12/29/21 0932  Resp 18 12/29/21 0932  SpO2 99 % 12/29/21 0932  Vitals shown include unvalidated device data.  Last Pain:  Vitals:   12/29/21 0931  TempSrc:   PainSc: 0-No pain         Complications: No notable events documented.

## 2021-12-29 NOTE — Op Note (Signed)
[facility] Patient Name: Erik Moore Procedure Date: 12/29/2021 8:52 AM MRN: [id number] Account #: [id number] Date of Birth: September 05, 1955 Admit Type: Outpatient Age: 66 Room: [facility] ENDO ROOM 3 Gender: Male Note Status: Finalized Instrument Name: Park Meo [id number] Procedure:             Colonoscopy Indications:           High risk colon cancer surveillance: Personal history                         of multiple (3 or more) adenomas Providers:             Andrey Farmer MD, MD Referring MD:          Marvia Pickles NP Medicines:             Monitored Anesthesia Care Complications:         No immediate complications. Estimated blood loss:                         Minimal. Procedure:             Pre-Anesthesia Assessment:                        - Prior to the procedure, a History and Physical was                         performed, and patient medications and allergies were                         reviewed. The patient is competent. The risks and                         benefits of the procedure and the sedation options and                         risks were discussed with the patient. All questions                         were answered and informed consent was obtained.                         Patient identification and proposed procedure were                         verified by the physician, the nurse, the                         anesthesiologist, the anesthetist and the technician                         in the endoscopy suite. Mental Status Examination:                         alert and oriented. Airway Examination: normal                         oropharyngeal airway and neck mobility. Respiratory  Examination: clear to auscultation. CV Examination:                         normal. Prophylactic Antibiotics: The patient does not                         require prophylactic antibiotics. Prior                          Anticoagulants: The patient has taken no previous                         anticoagulant or antiplatelet agents. ASA Grade                         Assessment: II - A patient with mild systemic disease.                         After reviewing the risks and benefits, the patient                         was deemed in satisfactory condition to undergo the                         procedure. The anesthesia plan was to use monitored                         anesthesia care (MAC). Immediately prior to                         administration of medications, the patient was                         re-assessed for adequacy to receive sedatives. The                         heart rate, respiratory rate, oxygen saturations,                         blood pressure, adequacy of pulmonary ventilation, and                         response to care were monitored throughout the                         procedure. The physical status of the patient was                         re-assessed after the procedure.                        After obtaining informed consent, the colonoscope was                         passed under direct vision. Throughout the procedure,                         the patient's blood pressure, pulse, and oxygen  saturations were monitored continuously. The                         Colonoscope was introduced through the anus and                         advanced to the the cecum, identified by appendiceal                         orifice and ileocecal valve. The colonoscopy was                         performed without difficulty. The patient tolerated                         the procedure well. The quality of the bowel                         preparation was inadequate. Findings:      The perianal and digital rectal examinations were normal.      A 3 mm polyp was found in the hepatic flexure. The polyp was sessile.       The polyp was removed with a cold snare. Resection and  retrieval were       complete. Estimated blood loss was minimal.      A 8 mm polyp was found in the transverse colon. The polyp was sessile.       The polyp was removed with a cold snare. Resection and retrieval were       complete. To prevent bleeding post-intervention, one hemostatic clip was       successfully placed. There was no bleeding at the end of the procedure.      A 2 mm polyp was found in the descending colon. The polyp was sessile.       The polyp was removed with a jumbo cold forceps. Resection and retrieval       were complete. Estimated blood loss was minimal.      Multiple small-mouthed diverticula were found in the sigmoid colon.      Internal hemorrhoids were found during retroflexion. The hemorrhoids       were Grade II (internal hemorrhoids that prolapse but reduce       spontaneously).      The exam was otherwise without abnormality on direct and retroflexion       views. Impression:            - Preparation of the colon was inadequate.                        - One 3 mm polyp at the hepatic flexure, removed with                         a cold snare. Resected and retrieved.                        - One 8 mm polyp in the transverse colon, removed with                         a cold snare. Resected and retrieved. Clip was placed.                        -  One 2 mm polyp in the descending colon, removed with                         a jumbo cold forceps. Resected and retrieved.                        - Diverticulosis in the sigmoid colon.                        - Internal hemorrhoids.                        - The examination was otherwise normal on direct and                         retroflexion views. Recommendation:        - Discharge patient to home.                        - Resume previous diet.                        - Continue present medications.                        - Await pathology results.                        - Repeat colonoscopy in 6 months because the  bowel                         preparation was suboptimal.                        - Return to referring physician as previously                         scheduled. Procedure Code(s):     --- Professional ---                        [phone], Colonoscopy, flexible; with removal of                         tumor(s), polyp(s), or other lesion(s) by snare                         technique                        45380, 2, Colonoscopy, flexible; with biopsy, single                         or multiple Diagnosis Code(s):     --- Professional ---                        Z86.010, Personal history of colonic polyps                        K64.1, Second degree hemorrhoids  K63.5, Polyp of colon                        K57.30, Diverticulosis of large intestine without                         perforation or abscess without bleeding CPT copyright 2019 [employer] Association. All rights reserved. The codes documented in this report are preliminary and upon coder review may  be revised to meet current compliance requirements. Andrey Farmer MD, MD 12/29/2021 9:31:56 AM Number of Addenda: 0 Note Initiated On: 12/29/2021 8:52 AM Scope Withdrawal Time: 0 hours 11 minutes 5 seconds  Total Procedure Duration: 0 hours 18 minutes 27 seconds  Estimated Blood Loss:  Estimated blood loss was minimal.      [facility]

## 2021-12-29 NOTE — Interval H&P Note (Signed)
History and Physical Interval Note:  12/29/2021 8:52 AM  Erik Moore  has presented today for surgery, with the diagnosis of HX of Colon Polyps.  The various methods of treatment have been discussed with the patient and family. After consideration of risks, benefits and other options for treatment, the patient has consented to  Procedure(s) with comments: COLONOSCOPY WITH PROPOFOL (N/A) - OFFICE SAYS PATIENT NEEDS AMP ANTIBIOTIC BEFORE PROCEDURE. CHECK WITH DR Haig Prophet. as a surgical intervention.  The patient's history has been reviewed, patient examined, no change in status, stable for surgery.  I have reviewed the patient's chart and labs.  Questions were answered to the patient's satisfaction.     [name]  Ok to proceed with colonoscopy

## 2021-12-29 NOTE — Anesthesia Postprocedure Evaluation (Signed)
Anesthesia Post Note  Patient: Erik Moore  Procedure(s) Performed: COLONOSCOPY WITH PROPOFOL  Patient location during evaluation: Endoscopy Anesthesia Type: General Level of consciousness: awake and alert Pain management: pain level controlled Vital Signs Assessment: post-procedure vital signs reviewed and stable Respiratory status: spontaneous breathing, nonlabored ventilation, respiratory function stable and patient connected to nasal cannula oxygen Cardiovascular status: blood pressure returned to baseline and stable Postop Assessment: no apparent nausea or vomiting Anesthetic complications: no   No notable events documented.   Last Vitals:  Vitals:   12/29/21 0935 12/29/21 0949  BP: 113/70   Pulse:    Resp:  20  Temp: (!) 35.9 C   SpO2:  94%    Last Pain:  Vitals:   12/29/21 0949  TempSrc:   PainSc: 0-No pain                 Precious Haws Nazirah Tri

## 2021-12-29 NOTE — Anesthesia Preprocedure Evaluation (Signed)
Anesthesia Evaluation  Patient identified by MRN, date of birth, ID band Patient awake    Reviewed: Allergy & Precautions, NPO status , Patient's Chart, lab work & pertinent test results  History of Anesthesia Complications Negative for: history of anesthetic complications  Airway Mallampati: III  TM Distance: <3 FB Neck ROM: full    Dental  (+) Chipped   Pulmonary neg pulmonary ROS, neg shortness of breath,    Pulmonary exam normal        Cardiovascular Exercise Tolerance: Good hypertension, (-) anginaNormal cardiovascular exam     Neuro/Psych negative neurological ROS  negative psych ROS   GI/Hepatic negative GI ROS, Neg liver ROS, neg GERD  ,  Endo/Other  negative endocrine ROS  Renal/GU Renal disease  negative genitourinary   Musculoskeletal   Abdominal   Peds  Hematology negative hematology ROS (+)   Anesthesia Other Findings Past Medical History: No date: Arthritis No date: Chronic kidney disease     Comment:  H/O KIDNEY STONES 07/17/2015: Colon polyp No date: Gout No date: History of kidney stones No date: Hyperlipidemia No date: Hypertension No date: Rib fracture No date: Umbilical hernia  Past Surgical History: 07/17/2015: COLONOSCOPY     Comment:  Dr Clayborne Dana No date: HERNIA REPAIR No date: JOINT REPLACEMENT No date: ROTATOR CUFF REPAIR 07/17/1963: TONSILLECTOMY 04/13/2021: TOTAL HIP ARTHROPLASTY; Left     Comment:  Procedure: TOTAL HIP ARTHROPLASTY ANTERIOR APPROACH;                Surgeon: Hessie Knows, MD;  Location: [facility] ORS;                Service: Orthopedics;  Laterality: Left; 97/35/3299: UMBILICAL HERNIA REPAIR; N/A     Comment:  Procedure: HERNIA REPAIR UMBILICAL ADULT;  Surgeon:               Robert Bellow, MD;  Location: [facility] ORS;  Service:               General;  Laterality: N/A;  BMI    Body Mass Index: 28.07 kg/m      Reproductive/Obstetrics negative OB ROS                              Anesthesia Physical Anesthesia Plan  ASA: 3  Anesthesia Plan: General   Post-op Pain Management:    Induction: Intravenous  PONV Risk Score and Plan: Propofol infusion and TIVA  Airway Management Planned: Natural Airway and Nasal Cannula  Additional Equipment:   Intra-op Plan:   Post-operative Plan:   Informed Consent: I have reviewed the patients History and Physical, chart, labs and discussed the procedure including the risks, benefits and alternatives for the proposed anesthesia with the patient or authorized representative who has indicated his/her understanding and acceptance.     Dental Advisory Given  Plan Discussed with: Anesthesiologist, CRNA and Surgeon  Anesthesia Plan Comments: (Patient consented for risks of anesthesia including but not limited to:  - adverse reactions to medications - risk of airway placement if required - damage to eyes, teeth, lips or other oral mucosa - nerve damage due to positioning  - sore throat or hoarseness - Damage to heart, brain, nerves, lungs, other parts of body or loss of life  Patient voiced understanding.)        Anesthesia Quick Evaluation

## 2021-12-29 NOTE — H&P (Signed)
Outpatient short stay form Pre-procedure 12/29/2021  [name], MD  Primary Physician: Latanya Maudlin, NP  Reason for visit:  Surveillance colonoscopy  History of present illness:    66 y/o gentleman with history of hypertension and hip replacement here for surveillance colonoscopy. Last colonoscopy was in 2017 with hyperplastic polyps removed. In 2012 he had several adenomatous polyps removed. No family history of GI malignancies. No blood thinners. No abdominal surgeries.    Current Facility-Administered Medications:    0.9 %  sodium chloride infusion, , Intravenous, Continuous, Alyzah Pelly, Hilton Cork, MD, Last Rate: 20 mL/hr at 12/29/21 0837, New Bag at 12/29/21 0837   ampicillin (OMNIPEN) 2 g in sodium chloride 0.9 % 100 mL IVPB, 2 g, Intravenous, Once, Durante Violett, Hilton Cork, MD  Medications Prior to Admission  Medication Sig Dispense Refill Last Dose   hydrochlorothiazide (HYDRODIURIL) 25 MG tablet Take 25 mg by mouth daily.  0 12/29/2021 at 0600   losartan (COZAAR) 50 MG tablet Take 50 mg by mouth every morning.   0 12/29/2021 at 0600   cholecalciferol (VITAMIN D3) 25 MCG (1000 UNIT) tablet Take 1,000 Units by mouth daily.      docusate sodium (COLACE) 100 MG capsule Take 1 capsule (100 mg total) by mouth 2 (two) times daily. 10 capsule 0    enoxaparin (LOVENOX) 40 MG/0.4ML injection Inject 0.4 mLs (40 mg total) into the skin daily for 14 days. 5.6 mL 0    fenofibrate (TRICOR) 48 MG tablet Take 48 mg by mouth daily.      HYDROcodone-acetaminophen (NORCO/VICODIN) 5-325 MG tablet Take 1-2 tablets by mouth every 4 (four) hours as needed for moderate pain (pain score 4-6). 40 tablet 0    indomethacin (INDOCIN) 50 MG capsule Take 50 mg by mouth 3 (three) times daily as needed (gout).      methocarbamol (ROBAXIN) 500 MG tablet Take 1 tablet (500 mg total) by mouth every 6 (six) hours as needed for muscle spasms. (Patient not taking: Reported on 10/26/2021) 30 tablet 0    polyethylene  glycol (MIRALAX / GLYCOLAX) 17 g packet Take 17 g by mouth daily as needed for mild constipation. (Patient not taking: Reported on 10/26/2021) 14 each 0    traMADol (ULTRAM) 50 MG tablet Take 1 tablet (50 mg total) by mouth every 6 (six) hours as needed. 30 tablet 0    vitamin B-12 (CYANOCOBALAMIN) 1000 MCG tablet Take 1,000 mcg by mouth daily.        No Known Allergies   Past Medical History:  Diagnosis Date   Arthritis    Chronic kidney disease    H/O KIDNEY STONES   Colon polyp 07/17/2015   Gout    History of kidney stones    Hyperlipidemia    Hypertension    Rib fracture    Umbilical hernia     Review of systems:  Otherwise negative.    Physical Exam  Gen: Alert, oriented. Appears stated age.  HEENT: PERRLA. Lungs: No respiratory distress. CV: RRR Abd: soft, benign, no masses Ext: No edema    Planned procedures: Proceed with colonoscopy. The patient understands the nature of the planned procedure, indications, risks, alternatives and potential complications including but not limited to bleeding, infection, perforation, damage to internal organs and possible oversedation/side effects from anesthesia. The patient agrees and gives consent to proceed.  Please refer to procedure notes for findings, recommendations and patient disposition/instructions.     [name], MD [facility]

## 2022-01-01 ENCOUNTER — Encounter: Payer: Self-pay | Admitting: Gastroenterology

## 2022-01-01 LAB — SURGICAL PATHOLOGY

## 2022-01-25 ENCOUNTER — Ambulatory Visit: Payer: No Typology Code available for payment source | Admitting: Dermatology

## 2022-03-01 DIAGNOSIS — E781 Pure hyperglyceridemia: Secondary | ICD-10-CM | POA: Diagnosis not present

## 2022-03-01 DIAGNOSIS — M5416 Radiculopathy, lumbar region: Secondary | ICD-10-CM | POA: Diagnosis not present

## 2022-03-01 DIAGNOSIS — Z131 Encounter for screening for diabetes mellitus: Secondary | ICD-10-CM | POA: Diagnosis not present

## 2022-03-01 DIAGNOSIS — Z87442 Personal history of urinary calculi: Secondary | ICD-10-CM | POA: Diagnosis not present

## 2022-03-01 DIAGNOSIS — I10 Essential (primary) hypertension: Secondary | ICD-10-CM | POA: Diagnosis not present

## 2022-03-01 DIAGNOSIS — Z96642 Presence of left artificial hip joint: Secondary | ICD-10-CM | POA: Diagnosis not present

## 2022-03-01 DIAGNOSIS — Z125 Encounter for screening for malignant neoplasm of prostate: Secondary | ICD-10-CM | POA: Diagnosis not present

## 2022-03-01 DIAGNOSIS — E538 Deficiency of other specified B group vitamins: Secondary | ICD-10-CM | POA: Diagnosis not present

## 2022-03-01 DIAGNOSIS — M48062 Spinal stenosis, lumbar region with neurogenic claudication: Secondary | ICD-10-CM | POA: Diagnosis not present

## 2022-03-01 DIAGNOSIS — M25512 Pain in left shoulder: Secondary | ICD-10-CM | POA: Diagnosis not present

## 2022-03-01 DIAGNOSIS — M1A09X0 Idiopathic chronic gout, multiple sites, without tophus (tophi): Secondary | ICD-10-CM | POA: Diagnosis not present

## 2022-03-01 DIAGNOSIS — Z1329 Encounter for screening for other suspected endocrine disorder: Secondary | ICD-10-CM | POA: Diagnosis not present

## 2022-03-01 DIAGNOSIS — M7021 Olecranon bursitis, right elbow: Secondary | ICD-10-CM | POA: Diagnosis not present

## 2022-03-01 DIAGNOSIS — Z86010 Personal history of colonic polyps: Secondary | ICD-10-CM | POA: Diagnosis not present

## 2022-03-01 DIAGNOSIS — Z0000 Encounter for general adult medical examination without abnormal findings: Secondary | ICD-10-CM | POA: Diagnosis not present

## 2022-03-01 DIAGNOSIS — K5730 Diverticulosis of large intestine without perforation or abscess without bleeding: Secondary | ICD-10-CM | POA: Diagnosis not present

## 2022-04-18 ENCOUNTER — Ambulatory Visit (INDEPENDENT_AMBULATORY_CARE_PROVIDER_SITE_OTHER): Payer: No Typology Code available for payment source | Admitting: Dermatology

## 2022-04-18 DIAGNOSIS — L570 Actinic keratosis: Secondary | ICD-10-CM | POA: Diagnosis not present

## 2022-04-18 DIAGNOSIS — L820 Inflamed seborrheic keratosis: Secondary | ICD-10-CM

## 2022-04-18 DIAGNOSIS — L578 Other skin changes due to chronic exposure to nonionizing radiation: Secondary | ICD-10-CM | POA: Diagnosis not present

## 2022-04-18 DIAGNOSIS — L821 Other seborrheic keratosis: Secondary | ICD-10-CM | POA: Diagnosis not present

## 2022-04-18 NOTE — Patient Instructions (Signed)
Due to recent changes in healthcare laws, you may see results of your pathology and/or laboratory studies on MyChart before the doctors have had a chance to review them. We understand that in some cases there may be results that are confusing or concerning to you. Please understand that not all results are received at the same time and often the doctors may need to interpret multiple results in order to provide you with the best plan of care or course of treatment. Therefore, we ask that you please give us 2 business days to thoroughly review all your results before contacting the office for clarification. Should we see a critical lab result, you will be contacted sooner.   If You Need Anything After Your Visit  If you have any questions or concerns for your doctor, please call our main line at [phone] and press option 4 to reach your doctor's medical assistant. If no one answers, please leave a voicemail as directed and we will return your call as soon as possible. Messages left after 4 pm will be answered the following business day.   You may also send us a message via MyChart. We typically respond to MyChart messages within 1-2 business days.  For prescription refills, please ask your pharmacy to contact our office. Our fax number is [phone].  If you have an urgent issue when the clinic is closed that cannot wait until the next business day, you can page your doctor at the number below.    Please note that while we do our best to be available for urgent issues outside of office hours, we are not available 24/7.   If you have an urgent issue and are unable to reach us, you may choose to seek medical care at your doctor's office, retail clinic, urgent care center, or emergency room.  If you have a medical emergency, please immediately call 911 or go to the emergency department.  Pager Numbers  - Dr. Kowalski: [phone]  - Dr. Moye: [phone]  - Dr. Stewart:  [phone]  In the event of inclement weather, please call our main line at [phone] for an update on the status of any delays or closures.  Dermatology Medication Tips: Please keep the boxes that topical medications come in in order to help keep track of the instructions about where and how to use these. Pharmacies typically print the medication instructions only on the boxes and not directly on the medication tubes.   If your medication is too expensive, please contact our office at [phone] option 4 or send us a message through MyChart.   We are unable to tell what your co-pay for medications will be in advance as this is different depending on your insurance coverage. However, we may be able to find a substitute medication at lower cost or fill out paperwork to get insurance to cover a needed medication.   If a prior authorization is required to get your medication covered by your insurance company, please allow us 1-2 business days to complete this process.  Drug prices often vary depending on where the prescription is filled and some pharmacies may offer cheaper prices.  The website www.goodrx.com contains coupons for medications through different pharmacies. The prices here do not account for what the cost may be with help from insurance (it may be cheaper with your insurance), but the website can give you the price if you did not use any insurance.  - You can print the associated coupon and take it with   your prescription to the pharmacy.  - You may also stop by our office during regular business hours and pick up a GoodRx coupon card.  - If you need your prescription sent electronically to a different pharmacy, notify our office through [place] MyChart or by phone at [phone] option 4.     Si Usted Necesita Algo Despus de Su Visita  Tambin puede enviarnos un mensaje a travs de MyChart. Por lo general respondemos a los mensajes de MyChart en el transcurso de 1 a 2  das hbiles.  Para renovar recetas, por favor pida a su farmacia que se ponga en contacto con nuestra oficina. Nuestro nmero de fax es el [phone].  Si tiene un asunto urgente cuando la clnica est cerrada y que no puede esperar hasta el siguiente da hbil, puede llamar/localizar a su doctor(a) al nmero que aparece a continuacin.   Por favor, tenga en cuenta que aunque hacemos todo lo posible para estar disponibles para asuntos urgentes fuera del horario de oficina, no estamos disponibles las 24 horas del da, los 7 das de la semana.   Si tiene un problema urgente y no puede comunicarse con nosotros, puede optar por buscar atencin mdica  en el consultorio de su doctor(a), en una clnica privada, en un centro de atencin urgente o en una sala de emergencias.  Si tiene una emergencia mdica, por favor llame inmediatamente al 911 o vaya a la sala de emergencias.  Nmeros de bper  - Dr. Kowalski: [phone]  - Dra. Moye: [phone]  - Dra. Stewart: [phone]  En caso de inclemencias del tiempo, por favor llame a nuestra lnea principal al [phone] para una actualizacin sobre el estado de cualquier retraso o cierre.  Consejos para la medicacin en dermatologa: Por favor, guarde las cajas en las que vienen los medicamentos de uso tpico para ayudarle a seguir las instrucciones sobre dnde y cmo usarlos. Las farmacias generalmente imprimen las instrucciones del medicamento slo en las cajas y no directamente en los tubos del medicamento.   Si su medicamento es muy caro, por favor, pngase en contacto con nuestra oficina llamando al [phone] y presione la opcin 4 o envenos un mensaje a travs de MyChart.   No podemos decirle cul ser su copago por los medicamentos por adelantado ya que esto es diferente dependiendo de la cobertura de su seguro. Sin embargo, es posible que podamos encontrar un medicamento sustituto a menor costo o llenar un formulario para que el  seguro cubra el medicamento que se considera necesario.   Si se requiere una autorizacin previa para que su compaa de seguros cubra su medicamento, por favor permtanos de 1 a 2 das hbiles para completar este proceso.  Los precios de los medicamentos varan con frecuencia dependiendo del lugar de dnde se surte la receta y alguna farmacias pueden ofrecer precios ms baratos.  El sitio web www.goodrx.com tiene cupones para medicamentos de diferentes farmacias. Los precios aqu no tienen en cuenta lo que podra costar con la ayuda del seguro (puede ser ms barato con su seguro), pero el sitio web puede darle el precio si no utiliz ningn seguro.  - Puede imprimir el cupn correspondiente y llevarlo con su receta a la farmacia.  - Tambin puede pasar por nuestra oficina durante el horario de atencin regular y recoger una tarjeta de cupones de GoodRx.  - Si necesita que su receta se enve electrnicamente a una farmacia diferente, informe a nuestra oficina a travs de MyChart de [place]   o por telfono llamando al [phone] y presione la opcin 4.  

## 2022-04-18 NOTE — Progress Notes (Signed)
   Follow-Up Visit   Subjective  Erik Moore is a 66 y.o. male who presents for the following: Actinic Keratosis (Recheck the mid frontal scalp and L malar cheek for precancerous skin lesions. Previously treated with LN2). The patient has spots, moles and lesions to be evaluated, some may be new or changing and the patient has concerns that these could be cancer.  The following portions of the chart were reviewed this encounter and updated as appropriate:   Tobacco  Allergies  Meds  Problems  Med Hx  Surg Hx  Fam Hx     Review of Systems:  No other skin or systemic complaints except as noted in HPI or Assessment and Plan.  Objective  Well appearing patient in no apparent distress; mood and affect are within normal limits.  A focused examination was performed including the face and scalp. Relevant physical exam findings are noted in the Assessment and Plan.  L malar cheek x 1, mid frontal scalp x 1 (2) Erythematous thin papules/macules with gritty scale.   R hand x 1 Erythematous stuck-on, waxy papule or plaque   Assessment & Plan  AK (actinic keratosis) (2) L malar cheek x 1, mid frontal scalp x 1 Destruction of lesion - L malar cheek x 1, mid frontal scalp x 1 Complexity: simple   Destruction method: cryotherapy   Informed consent: discussed and consent obtained   Timeout:  patient name, date of birth, surgical site, and procedure verified Lesion destroyed using liquid nitrogen: Yes   Region frozen until ice ball extended beyond lesion: Yes   Outcome: patient tolerated procedure well with no complications   Post-procedure details: wound care instructions given    Inflamed seborrheic keratosis R hand x 1 Symptomatic, irritating, patient would like treated. Destruction of lesion - R hand x 1 Complexity: simple   Destruction method: cryotherapy   Informed consent: discussed and consent obtained   Timeout:  patient name, date of birth, surgical site, and procedure  verified Lesion destroyed using liquid nitrogen: Yes   Region frozen until ice ball extended beyond lesion: Yes   Outcome: patient tolerated procedure well with no complications   Post-procedure details: wound care instructions given    Seborrheic Keratoses - Stuck-on, waxy, tan-brown papules and/or plaques  - Benign-appearing - Discussed benign etiology and prognosis. - Observe - Call for any changes  Actinic Damage - chronic, secondary to cumulative UV radiation exposure/sun exposure over time - diffuse scaly erythematous macules with underlying dyspigmentation - Recommend daily broad spectrum sunscreen SPF 30+ to sun-exposed areas, reapply every 2 hours as needed.  - Recommend staying in the shade or wearing long sleeves, sun glasses (UVA+UVB protection) and wide brim hats (4-inch brim around the entire circumference of the hat). - Call for new or changing lesions.  Return in about 1 year (around 04/19/2023) for AK follow up .  Luther Redo, CMA, am acting as scribe for Sarina Ser, MD . Documentation: I have reviewed the above documentation for accuracy and completeness, and I agree with the above.  Sarina Ser, MD

## 2022-04-23 ENCOUNTER — Encounter: Payer: Self-pay | Admitting: Dermatology

## 2022-10-01 IMAGING — XA DG HIP (WITH PELVIS) OPERATIVE*L*
1 series · 4 of 4 positions shown · non-contrast
Comparison: Subsequently obtained postoperative radiograph

CLINICAL DATA: Left hip arthroplasty

EXAM:
OPERATIVE left HIP (WITH PELVIS IF PERFORMED) 1 VIEW
TECHNIQUE: Fluoroscopic spot image(s) were submitted for interpretation
post-operatively.

[Series 5: ortho standard · 4 of 9 frames shown]
[frame 1/9]
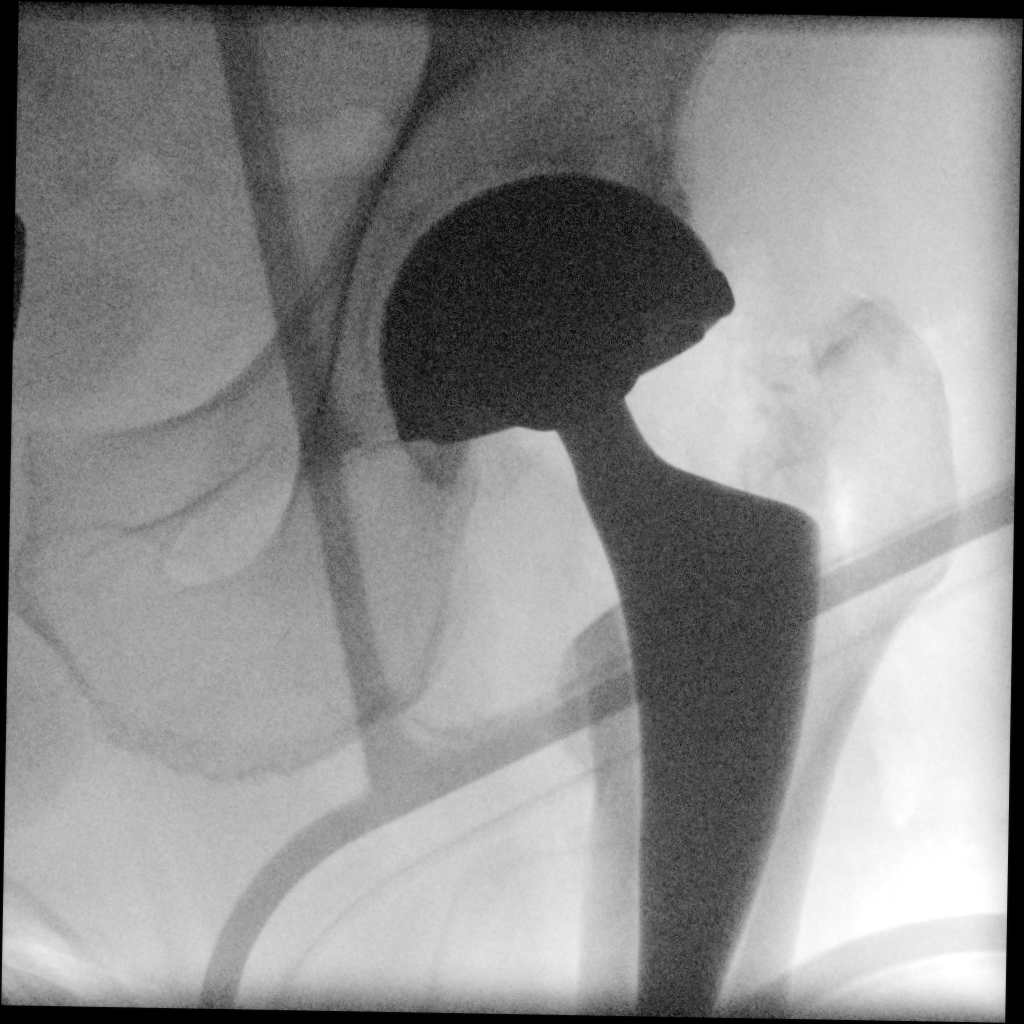
[frame 2/9]
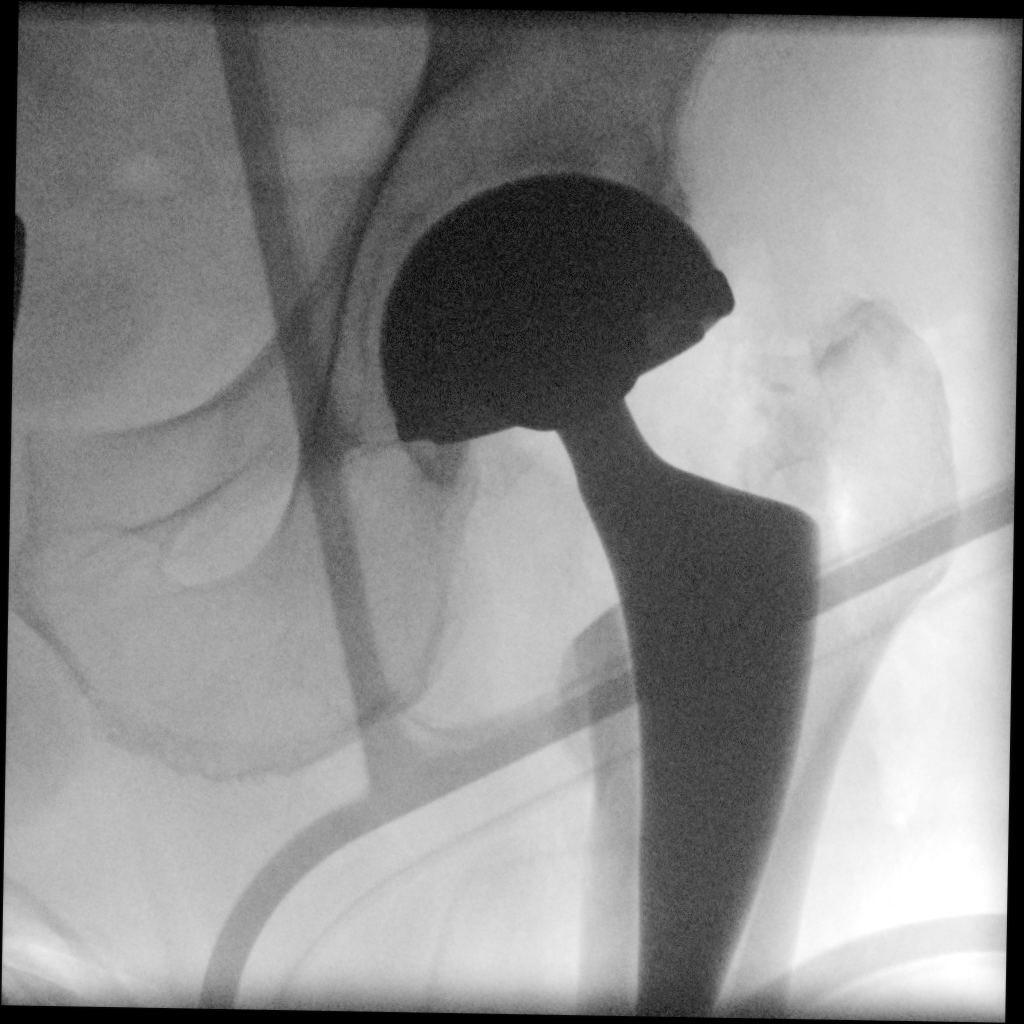
[frame 5/9]
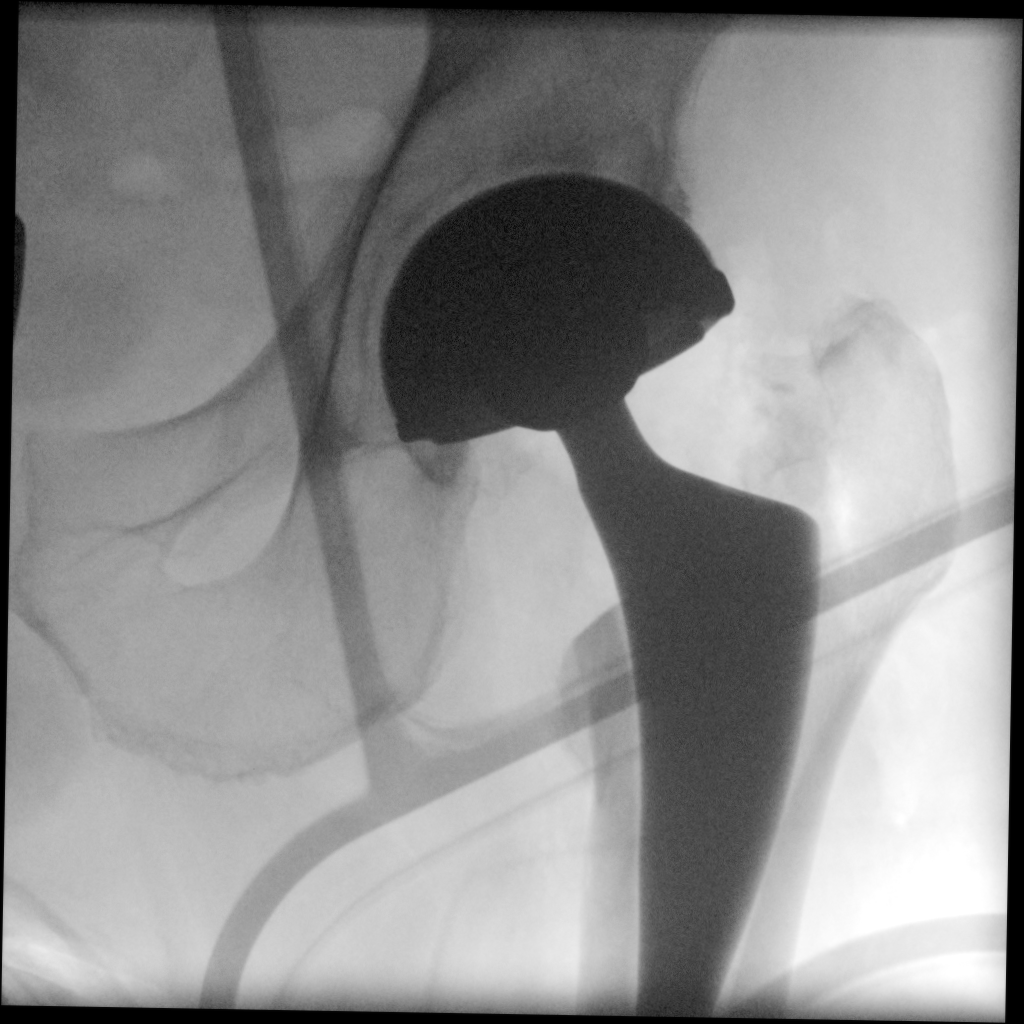
[frame 8/9]
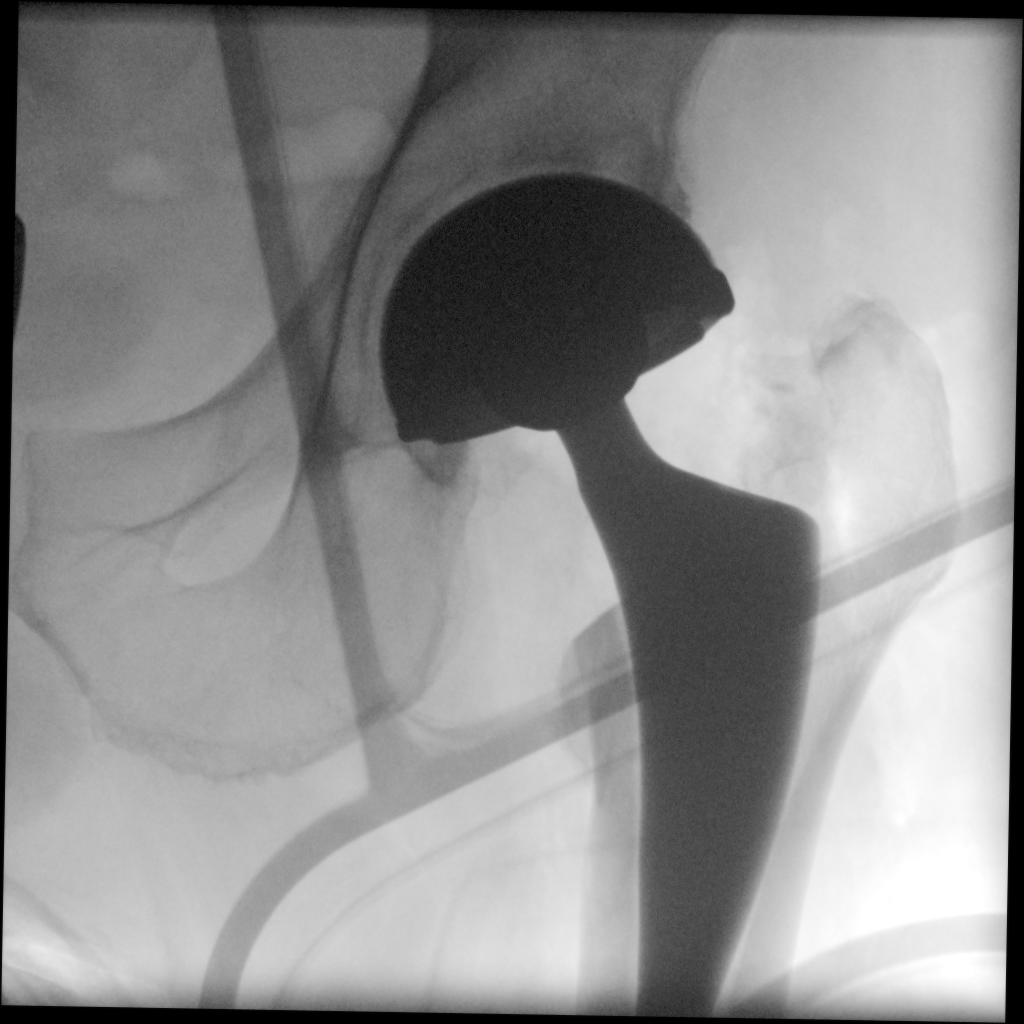

[4 of 4 positions shown; findings below may reference images not displayed]

FINDINGS: Nine C-arm fluoroscopic images (though all nine images are a single
view) were obtained intraoperatively and submitted for post
operative interpretation. The patient is status post left hip
arthroplasty. Hardware alignment is within expected limits, without
evidence of immediate complication. Fluoro time 18 seconds. Please
see the performing provider's procedural report for further detail.
IMPRESSION: Status post left hip arthroplasty.

## 2022-10-01 IMAGING — DX DG HIP (WITH OR WITHOUT PELVIS) 2-3V*L*
2 series · 2 of 2 positions shown · non-contrast
Comparison: Intraoperative radiographs obtained earlier the same
day

CLINICAL DATA: Left total hip arthroplasty

EXAM:
DG HIP (WITH OR WITHOUT PELVIS) 2-3V LEFT

[hip ap]
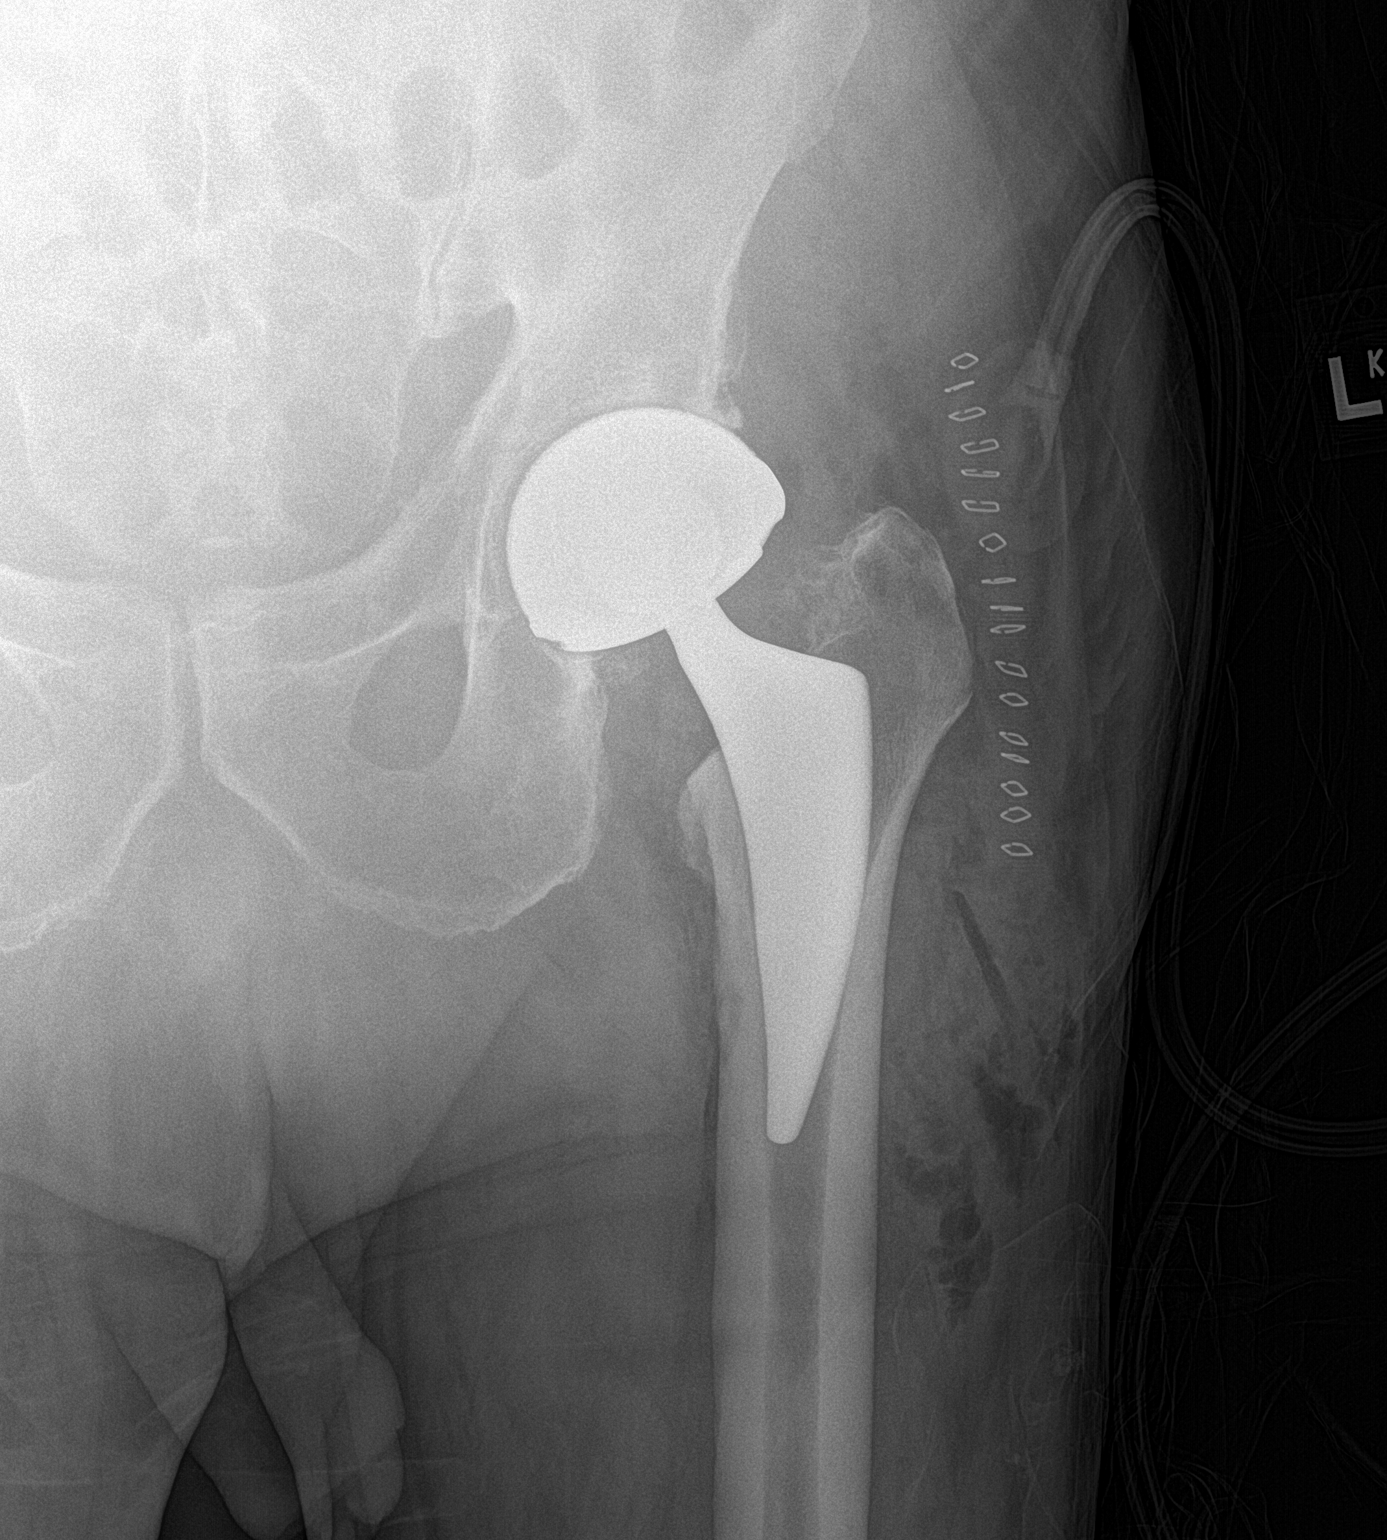

[hip lat]
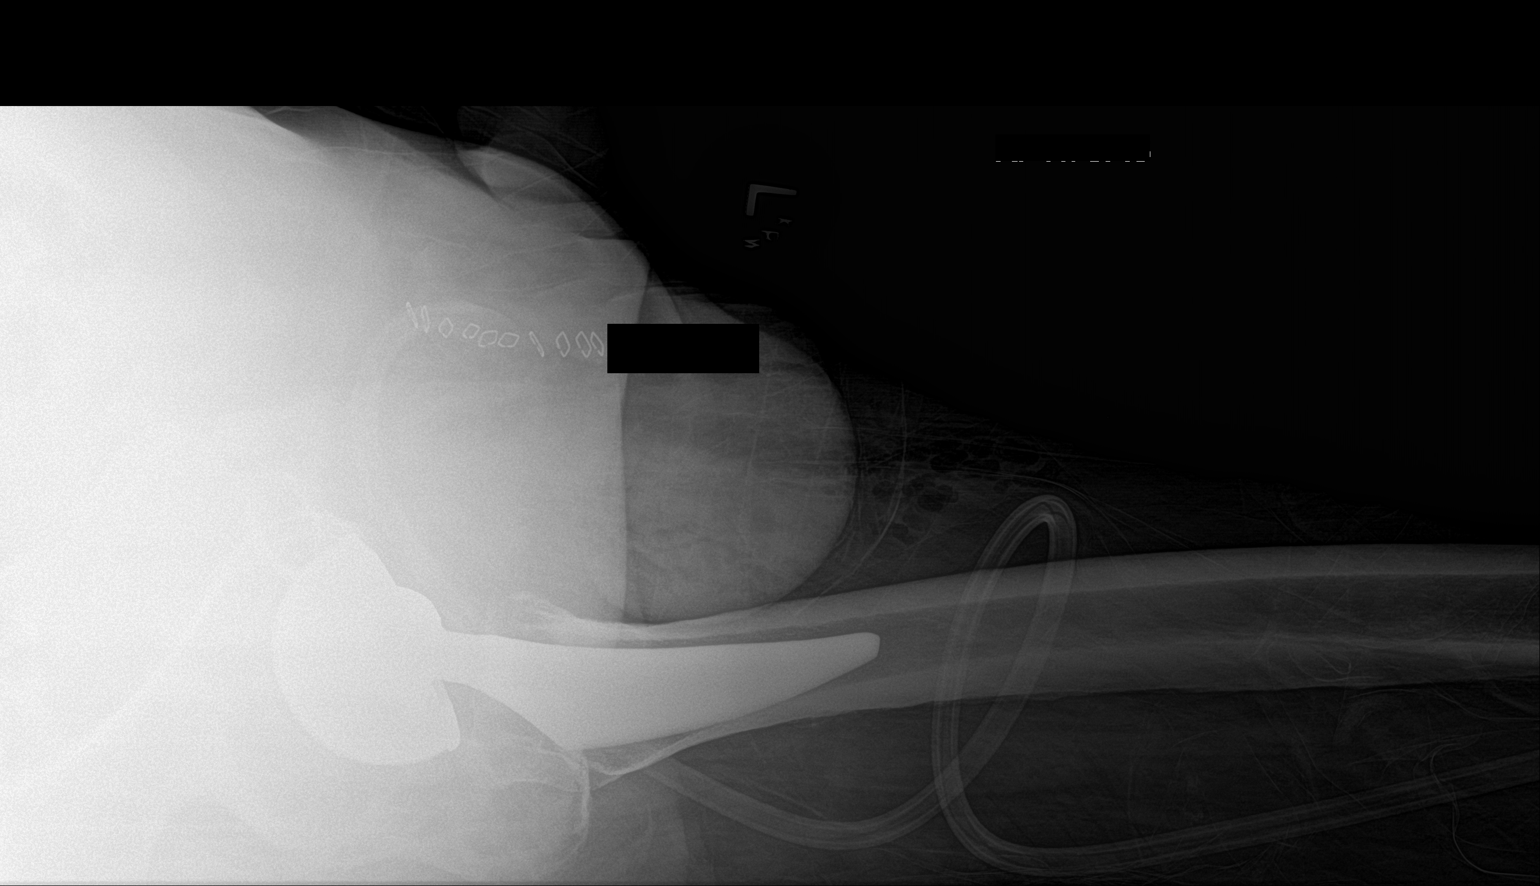

[2 of 2 positions shown; findings below may reference images not displayed]

FINDINGS: The patient is status post left hip arthroplasty. Hardware alignment
is within expected limits, without evidence of immediate
complication. Expected postsurgical changes including overlying skin
staples, soft tissue swelling and soft tissue gas, and a drain are
noted.
IMPRESSION: Status post left hip arthroplasty without evidence of immediate
complication.

## 2023-04-24 ENCOUNTER — Encounter: Payer: Self-pay | Admitting: Dermatology

## 2023-04-24 ENCOUNTER — Ambulatory Visit: Payer: Medicare HMO | Admitting: Dermatology

## 2023-04-24 VITALS — BP 123/66

## 2023-04-24 DIAGNOSIS — D229 Melanocytic nevi, unspecified: Secondary | ICD-10-CM

## 2023-04-24 DIAGNOSIS — Z79899 Other long term (current) drug therapy: Secondary | ICD-10-CM

## 2023-04-24 DIAGNOSIS — L570 Actinic keratosis: Secondary | ICD-10-CM

## 2023-04-24 DIAGNOSIS — Z1283 Encounter for screening for malignant neoplasm of skin: Secondary | ICD-10-CM | POA: Diagnosis not present

## 2023-04-24 DIAGNOSIS — L813 Cafe au lait spots: Secondary | ICD-10-CM

## 2023-04-24 DIAGNOSIS — W908XXA Exposure to other nonionizing radiation, initial encounter: Secondary | ICD-10-CM

## 2023-04-24 DIAGNOSIS — L821 Other seborrheic keratosis: Secondary | ICD-10-CM

## 2023-04-24 DIAGNOSIS — L578 Other skin changes due to chronic exposure to nonionizing radiation: Secondary | ICD-10-CM

## 2023-04-24 DIAGNOSIS — D692 Other nonthrombocytopenic purpura: Secondary | ICD-10-CM

## 2023-04-24 DIAGNOSIS — L219 Seborrheic dermatitis, unspecified: Secondary | ICD-10-CM

## 2023-04-24 DIAGNOSIS — L299 Pruritus, unspecified: Secondary | ICD-10-CM

## 2023-04-24 DIAGNOSIS — B353 Tinea pedis: Secondary | ICD-10-CM

## 2023-04-24 DIAGNOSIS — L814 Other melanin hyperpigmentation: Secondary | ICD-10-CM

## 2023-04-24 DIAGNOSIS — D1801 Hemangioma of skin and subcutaneous tissue: Secondary | ICD-10-CM

## 2023-04-24 DIAGNOSIS — Z7189 Other specified counseling: Secondary | ICD-10-CM

## 2023-04-24 DIAGNOSIS — Z872 Personal history of diseases of the skin and subcutaneous tissue: Secondary | ICD-10-CM

## 2023-04-24 DIAGNOSIS — L820 Inflamed seborrheic keratosis: Secondary | ICD-10-CM

## 2023-04-24 MED ORDER — KETOCONAZOLE 2 % EX CREA: 1.0000 | TOPICAL_CREAM | Freq: Every day | CUTANEOUS | 3 refills | Status: AC

## 2023-04-24 MED ORDER — KETOCONAZOLE 2 % EX SHAM
1.0000 | MEDICATED_SHAMPOO | CUTANEOUS | 11 refills | Status: AC
Start: 2023-04-24 — End: ?

## 2023-04-24 MED ORDER — TERBINAFINE HCL 250 MG PO TABS: 250.0000 mg | ORAL_TABLET | Freq: Every day | ORAL | 0 refills | Status: AC

## 2023-04-24 NOTE — Patient Instructions (Addendum)

## 2023-04-24 NOTE — Progress Notes (Signed)
Follow-Up Visit   Subjective  Erik Moore is a 67 y.o. male who presents for the following: Skin Cancer Screening and Full Body Skin Exam, hx of Aks, check spot scalp, has been treated a few times in past, scalp itchy, ~24m  The patient presents for Total-Body Skin Exam (TBSE) for skin cancer screening and mole check. The patient has spots, moles and lesions to be evaluated, some may be new or changing and the patient may have concern these could be cancer.    The following portions of the chart were reviewed this encounter and updated as appropriate: medications, allergies, medical history  Review of Systems:  No other skin or systemic complaints except as noted in HPI or Assessment and Plan.  Objective  Well appearing patient in no apparent distress; mood and affect are within normal limits.  A full examination was performed including scalp, head, eyes, ears, nose, lips, neck, chest, axillae, abdomen, back, buttocks, bilateral upper extremities, bilateral lower extremities, hands, feet, fingers, toes, fingernails, and toenails. All findings within normal limits unless otherwise noted below.   Relevant physical exam findings are noted in the Assessment and Plan.  mid frontal scalp x 1, L ear x 2, R ear x 1 (4) Pink scaly macules  R leg x 3 (3) Stuck on waxy paps with erythema    Assessment & Plan   SKIN CANCER SCREENING PERFORMED TODAY.  ACTINIC DAMAGE - Chronic condition, secondary to cumulative UV/sun exposure - diffuse scaly erythematous macules with underlying dyspigmentation - Recommend daily broad spectrum sunscreen SPF 30+ to sun-exposed areas, reapply every 2 hours as needed.  - Staying in the shade or wearing long sleeves, sun glasses (UVA+UVB protection) and wide brim hats (4-inch brim around the entire circumference of the hat) are also recommended for sun protection.  - Call for new or changing lesions.  LENTIGINES, SEBORRHEIC KERATOSES, HEMANGIOMAS - Benign  normal skin lesions - Benign-appearing - Call for any changes  MELANOCYTIC NEVI - Tan-brown and/or pink-flesh-colored symmetric macules and papules - Benign appearing on exam today - Observation - Call clinic for new or changing moles - Recommend daily use of broad spectrum spf 30+ sunscreen to sun-exposed areas.   SEBORRHEIC DERMATITIS with PRURITIS Scalp Exam: scalp clear today  Chronic and persistent condition with duration or expected duration over one year. Condition is symptomatic / bothersome to patient. Not to goal.   Seborrheic Dermatitis is a chronic persistent rash characterized by pinkness and scaling most commonly of the mid face but also can occur on the scalp (dandruff), ears; mid chest, mid back and groin.  It tends to be exacerbated by stress and cooler weather.  People who have neurologic disease may experience new onset or exacerbation of existing seborrheic dermatitis.  The condition is not curable but treatable and can be controlled.  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  Treatable, but not curable. arms - Violaceous macules and patches - Benign - Related to trauma, age, sun damage and/or use of blood thinners, chronic use of topical and/or oral steroids - Observe - Can use OTC arnica containing moisturizer such as Dermend Bruise Formula if desired - Call for worsening or other concerns  TINEA PEDIS Feet Chronic and persistent  condition with duration or expected duration over one year. Condition is symptomatic / bothersome to patient. Not to goal.  Labs from 03/12/2023 Exam: Scaling and maceration web spaces and over distal and lateral soles.  Treatment Plan: Start Terbinafine 250mg  1 po qd   Start Ketoconazole 2% cr qhs to feet   Terbinafine Counseling  Terbinafine is an anti-fungal medicine that can be applied to the skin (over the counter) or taken by mouth (prescription) to treat fungal infections. The pill version is often used to treat fungal infections of the nails or scalp. While most people do not have any side effects from taking terbinafine pills, some possible side effects of the medicine can include taste changes, headache, loss of smell, vision changes, nausea, vomiting, or diarrhea.   Rare side effects can include irritation of the liver, allergic reaction, or decrease in blood counts (which may show up as not feeling well or developing an infection). If you are concerned about any of these side effects, please stop the medicine and call your doctor, or in the case of an emergency such as feeling very unwell, seek immediate medical care.    AK (actinic keratosis) (4) mid frontal scalp x 1, L ear x 2, R ear x 1  Actinic keratoses are precancerous spots that appear secondary to cumulative UV radiation exposure/sun exposure over time. They are chronic with expected duration over 1 year. A portion of actinic keratoses will progress to squamous cell carcinoma of the skin. It is not possible to reliably predict which spots will progress to skin cancer and so treatment is recommended to prevent development of skin cancer.  Recommend daily broad spectrum sunscreen SPF 30+ to sun-exposed areas, reapply every 2 hours as needed.  Recommend staying in the shade or wearing long sleeves, sun glasses (UVA+UVB protection) and wide brim hats (4-inch brim around the entire circumference of the hat). Call for new or changing lesions.  Destruction of lesion - mid frontal scalp x 1, L ear x 2, R ear x 1 (4) Complexity: simple   Destruction method: cryotherapy   Informed consent: discussed and consent obtained   Timeout:  patient name, date of birth, surgical site, and procedure  verified Lesion destroyed using liquid nitrogen: Yes   Region frozen until ice ball extended beyond lesion: Yes   Outcome: patient tolerated procedure well with no complications   Post-procedure details: wound care instructions given    Inflamed seborrheic keratosis (3) R leg x 3  Symptomatic, irritating, patient would like treated.  Destruction of lesion - R leg x 3 (3) Complexity: simple   Destruction method: cryotherapy   Informed consent: discussed and consent obtained   Timeout:  patient name, date of birth, surgical site, and procedure verified Lesion destroyed using liquid nitrogen: Yes   Region frozen until ice ball extended beyond lesion: Yes   Outcome: patient tolerated procedure well with no complications   Post-procedure details: wound care instructions given     Return in about 1 year (around 04/23/2024) for UBSE, Hx of AKs.  I, Ardis Rowan, RMA, am acting as scribe for Armida Sans, MD .   Documentation: I have reviewed the above documentation for accuracy and completeness, and I agree with the above.  Armida Sans, MD

## 2023-04-30 ENCOUNTER — Encounter: Payer: Self-pay | Admitting: Dermatology

## 2023-08-01 ENCOUNTER — Ambulatory Visit: Payer: Medicare Other | Admitting: Dermatology

## 2023-08-01 DIAGNOSIS — W908XXA Exposure to other nonionizing radiation, initial encounter: Secondary | ICD-10-CM

## 2023-08-01 DIAGNOSIS — L821 Other seborrheic keratosis: Secondary | ICD-10-CM | POA: Diagnosis not present

## 2023-08-01 DIAGNOSIS — L570 Actinic keratosis: Secondary | ICD-10-CM | POA: Diagnosis not present

## 2023-08-01 NOTE — Progress Notes (Signed)
   Follow-Up Visit   Subjective  Erik Moore is a 68 y.o. male who presents for the following: check spot frontal scalp, has had LN2 x 2 treatments, last LN2 04/24/23 but not sure if cleared, check spot crown scalp, has had for years, itchy The patient has spots, moles and lesions to be evaluated, some may be new or changing and the patient may have concern these could be cancer.   The following portions of the chart were reviewed this encounter and updated as appropriate: medications, allergies, medical history  Review of Systems:  No other skin or systemic complaints except as noted in HPI or Assessment and Plan.  Objective  Well appearing patient in no apparent distress; mood and affect are within normal limits.   A focused examination was performed of the following areas: scalp  Relevant exam findings are noted in the Assessment and Plan.  frontal scalp x 2 (2) Pink scaly macules  Assessment & Plan   SEBORRHEIC KERATOSIS Crown scalp - Stuck-on, waxy, tan-brown papules and/or plaques  - Benign-appearing - Discussed benign etiology and prognosis. - Observe - Call for any changes  AK (ACTINIC KERATOSIS) (2) frontal scalp x 2 (2) No treatment today  Actinic keratoses are precancerous spots that appear secondary to cumulative UV radiation exposure/sun exposure over time. They are chronic with expected duration over 1 year. A portion of actinic keratoses will progress to squamous cell carcinoma of the skin. It is not possible to reliably predict which spots will progress to skin cancer and so treatment is recommended to prevent development of skin cancer.  Recommend daily broad spectrum sunscreen SPF 30+ to sun-exposed areas, reapply every 2 hours as needed.  Recommend staying in the shade or wearing long sleeves, sun glasses (UVA+UVB protection) and wide brim hats (4-inch brim around the entire circumference of the hat). Call for new or changing lesions.  Return for as  scheduled with Dr. Gwen Pounds for UBSE, Hx of AKs.  I, Ardis Rowan, RMA, am acting as scribe for Elie Goody, MD .   Documentation: I have reviewed the above documentation for accuracy and completeness, and I agree with the above.  Elie Goody, MD

## 2023-08-01 NOTE — Patient Instructions (Signed)

## 2023-08-04 ENCOUNTER — Encounter: Payer: Self-pay | Admitting: Dermatology

## 2024-01-15 ENCOUNTER — Encounter: Payer: Self-pay | Admitting: Dermatology

## 2024-01-15 ENCOUNTER — Ambulatory Visit: Admitting: Dermatology

## 2024-01-15 DIAGNOSIS — L820 Inflamed seborrheic keratosis: Secondary | ICD-10-CM

## 2024-01-15 NOTE — Patient Instructions (Addendum)

## 2024-01-15 NOTE — Progress Notes (Signed)
   Follow-Up Visit   Subjective  Erik Moore is a 68 y.o. male who presents for the following: check spot frontal scalp, had one previously that was txted with LN2 a few times now he feels like there are 2, itchy  The following portions of the chart were reviewed this encounter and updated as appropriate: medications, allergies, medical history  Review of Systems:  No other skin or systemic complaints except as noted in HPI or Assessment and Plan.  Objective  Well appearing patient in no apparent distress; mood and affect are within normal limits.  A focused examination was performed of the following areas: scalp  Relevant exam findings are noted in the Assessment and Plan.  midline ant scalp x 2 (2) Stuck on waxy paps with erythema  Assessment & Plan   INFLAMED SEBORRHEIC KERATOSIS (2) midline ant scalp x 2 (2) Symptomatic, irritating, patient would like treated. Destruction of lesion - midline ant scalp x 2 (2) Complexity: simple   Destruction method: cryotherapy   Informed consent: discussed and consent obtained   Timeout:  patient name, date of birth, surgical site, and procedure verified Lesion destroyed using liquid nitrogen: Yes   Region frozen until ice ball extended beyond lesion: Yes   Outcome: patient tolerated procedure well with no complications   Post-procedure details: wound care instructions given     Return for as scheduled for UBSE, hx of AKs, recheck ISKs midline ant sclap.  I, Grayce Saunas, RMA, am acting as scribe for Alm Rhyme, MD .   Documentation: I have reviewed the above documentation for accuracy and completeness, and I agree with the above.  Alm Rhyme, MD

## 2024-03-02 ENCOUNTER — Ambulatory Visit (INDEPENDENT_AMBULATORY_CARE_PROVIDER_SITE_OTHER): Admitting: Dermatology

## 2024-03-02 DIAGNOSIS — W908XXA Exposure to other nonionizing radiation, initial encounter: Secondary | ICD-10-CM

## 2024-03-02 DIAGNOSIS — Z1283 Encounter for screening for malignant neoplasm of skin: Secondary | ICD-10-CM | POA: Diagnosis not present

## 2024-03-02 DIAGNOSIS — L820 Inflamed seborrheic keratosis: Secondary | ICD-10-CM | POA: Diagnosis not present

## 2024-03-02 DIAGNOSIS — L578 Other skin changes due to chronic exposure to nonionizing radiation: Secondary | ICD-10-CM | POA: Diagnosis not present

## 2024-03-02 DIAGNOSIS — D229 Melanocytic nevi, unspecified: Secondary | ICD-10-CM

## 2024-03-02 DIAGNOSIS — L570 Actinic keratosis: Secondary | ICD-10-CM | POA: Diagnosis not present

## 2024-03-02 DIAGNOSIS — D1801 Hemangioma of skin and subcutaneous tissue: Secondary | ICD-10-CM

## 2024-03-02 DIAGNOSIS — D692 Other nonthrombocytopenic purpura: Secondary | ICD-10-CM | POA: Diagnosis not present

## 2024-03-02 DIAGNOSIS — L814 Other melanin hyperpigmentation: Secondary | ICD-10-CM

## 2024-03-02 NOTE — Patient Instructions (Addendum)

## 2024-03-02 NOTE — Progress Notes (Unsigned)
 Follow-Up Visit   Subjective  Erik Moore is a 68 y.o. male who presents for the following: Skin Cancer Screening and Upper Body Skin Exam, hx of precancers.   The patient presents for Upper Body Skin Exam (UBSE) for skin cancer screening and mole check. The patient has spots, moles and lesions to be evaluated, some may be new or changing and the patient may have concern these could be cancer.  The following portions of the chart were reviewed this encounter and updated as appropriate: medications, allergies, medical history  Review of Systems:  No other skin or systemic complaints except as noted in HPI or Assessment and Plan.  Objective  Well appearing patient in no apparent distress; mood and affect are within normal limits.  All skin waist up examined. Relevant physical exam findings are noted in the Assessment and Plan.  midline anterior scalp x 1 Stuck-on, waxy, tan-brown papules and plaques -- Discussed benign etiology and prognosis.  face, ears x 5 (5) Erythematous thin papules/macules with gritty scale.   Assessment & Plan   INFLAMED SEBORRHEIC KERATOSIS midline anterior scalp x 1 Symptomatic, irritating, patient would like treated.  Destruction of lesion - midline anterior scalp x 1 Complexity: simple   Destruction method: cryotherapy   Informed consent: discussed and consent obtained   Timeout:  patient name, date of birth, surgical site, and procedure verified Lesion destroyed using liquid nitrogen: Yes   Region frozen until ice ball extended beyond lesion: Yes   Outcome: patient tolerated procedure well with no complications   Post-procedure details: wound care instructions given    AK (ACTINIC KERATOSIS) (5) face, ears x 5 (5) ACTINIC DAMAGE - chronic, secondary to cumulative UV radiation exposure/sun exposure over time - diffuse scaly erythematous macules with underlying dyspigmentation - Recommend daily broad spectrum sunscreen SPF 30+ to sun-exposed  areas, reapply every 2 hours as needed.  - Recommend staying in the shade or wearing long sleeves, sun glasses (UVA+UVB protection) and wide brim hats (4-inch brim around the entire circumference of the hat). - Call for new or changing lesions.  Destruction of lesion - face, ears x 5 (5) Complexity: simple   Destruction method: cryotherapy   Informed consent: discussed and consent obtained   Timeout:  patient name, date of birth, surgical site, and procedure verified Lesion destroyed using liquid nitrogen: Yes   Region frozen until ice ball extended beyond lesion: Yes   Outcome: patient tolerated procedure well with no complications   Post-procedure details: wound care instructions given     Skin cancer screening performed today.  Lentigines, Seborrheic Keratoses, Hemangiomas - Benign normal skin lesions - Benign-appearing - Call for any changes  Melanocytic Nevi - Tan-brown and/or pink-flesh-colored symmetric macules and papules - Benign appearing on exam today - Observation - Call clinic for new or changing moles - Recommend daily use of broad spectrum spf 30+ sunscreen to sun-exposed areas.   Purpura - Chronic; persistent and recurrent.  Treatable, but not curable. Arms  - Violaceous macules and patches - Benign - Related to trauma, age, sun damage and/or use of blood thinners, chronic use of topical and/or oral steroids - Observe - Can use OTC arnica containing moisturizer such as Dermend Bruise Formula if desired - Call for worsening or other concerns   Return in about 1 year (around 03/02/2025) for TBSE, hx of AKs.  LILLETTE Fay Kirks, CMA, am acting as scribe for Alm Rhyme, MD .   Documentation: I have reviewed the above documentation for  accuracy and completeness, and I agree with the above.  Alm Rhyme, MD

## 2024-03-03 ENCOUNTER — Encounter: Payer: Self-pay | Admitting: Dermatology

## 2024-04-23 ENCOUNTER — Ambulatory Visit: Payer: Medicare HMO | Admitting: Dermatology

## 2025-03-02 ENCOUNTER — Ambulatory Visit: Admitting: Dermatology
